# Patient Record
Sex: Male | Born: 1980
Health system: Southern US, Community
[De-identification: ages and names within clinical notes are randomized; demographics above are authoritative.]

## PROBLEM LIST (undated history)

## (undated) DIAGNOSIS — G4730 Sleep apnea, unspecified: Secondary | ICD-10-CM

## (undated) DIAGNOSIS — K219 Gastro-esophageal reflux disease without esophagitis: Secondary | ICD-10-CM

## (undated) HISTORY — DX: Gastro-esophageal reflux disease without esophagitis: K21.9

## (undated) HISTORY — PX: NO PAST SURGERIES: SHX2092

## (undated) HISTORY — DX: Sleep apnea, unspecified: G47.30

---

## 2012-04-27 ENCOUNTER — Telehealth: Payer: Self-pay | Admitting: Nurse Practitioner

## 2012-04-27 NOTE — Telephone Encounter (Signed)
Patient was evaluated at the "clinic" and told he has rosacea.  C/o of facial redness, irritation, and pustules x 1-2 months.  He was hoping to schedule an appt for today.  Has not tried anything OTC.  Suggested a mild facial cleanser and moisturizer or one specifically formulated for rosacea.  Explained that I am unable to schedule an appt for today but may be able to schedule one for later in the week.  He wanted to be seen sooner.  I also suggested that he may want to see a dermatologist for management and that if he is interested in this option he should call his insurance company to make sure a referral is not needed.  Pt was agreeable to this and will call back if needed.

## 2012-06-20 ENCOUNTER — Other Ambulatory Visit: Payer: Self-pay | Admitting: Nurse Practitioner

## 2012-08-16 ENCOUNTER — Other Ambulatory Visit: Payer: Self-pay | Admitting: Family Medicine

## 2012-09-16 ENCOUNTER — Other Ambulatory Visit: Payer: Self-pay | Admitting: Nurse Practitioner

## 2012-10-22 ENCOUNTER — Other Ambulatory Visit: Payer: Self-pay | Admitting: Nurse Practitioner

## 2012-10-26 NOTE — Telephone Encounter (Signed)
Last seen 11/17/11  DFS

## 2012-12-02 ENCOUNTER — Other Ambulatory Visit: Payer: Self-pay

## 2012-12-07 ENCOUNTER — Encounter: Payer: Self-pay | Admitting: Family Medicine

## 2012-12-07 ENCOUNTER — Ambulatory Visit (INDEPENDENT_AMBULATORY_CARE_PROVIDER_SITE_OTHER): Payer: No Typology Code available for payment source | Admitting: Family Medicine

## 2012-12-07 VITALS — BP 124/78 | HR 86 | Temp 98.2°F | Ht 67.0 in | Wt 166.0 lb

## 2012-12-07 DIAGNOSIS — F17200 Nicotine dependence, unspecified, uncomplicated: Secondary | ICD-10-CM

## 2012-12-07 DIAGNOSIS — Z7189 Other specified counseling: Secondary | ICD-10-CM

## 2012-12-07 DIAGNOSIS — Z716 Tobacco abuse counseling: Secondary | ICD-10-CM

## 2012-12-07 DIAGNOSIS — Z136 Encounter for screening for cardiovascular disorders: Secondary | ICD-10-CM

## 2012-12-07 DIAGNOSIS — K219 Gastro-esophageal reflux disease without esophagitis: Secondary | ICD-10-CM

## 2012-12-07 MED ORDER — OMEPRAZOLE 40 MG PO CPDR: 40.0000 mg | DELAYED_RELEASE_CAPSULE | Freq: Every day | ORAL | Status: DC

## 2012-12-07 NOTE — Patient Instructions (Signed)
Heartburn Heartburn is a painful, burning sensation in the chest. It may feel worse in certain positions, such as lying down or bending over. It is caused by stomach acid backing up into the tube that carries food from the mouth down to the stomach (lower esophagus).  CAUSES   Large meals.  Certain foods and drinks.  Exercise.  Increased acid production.  Being overweight or obese.  Certain medicines. SYMPTOMS   Burning pain in the chest or lower throat.  Bitter taste in the mouth.  Coughing. DIAGNOSIS  If the usual treatments for heartburn do not improve your symptoms, then tests may be done to see if there is another condition present. Possible tests may include:  X-rays.  Endoscopy. This is when a tube with a light and a camera on the end is used to examine the esophagus and the stomach.  A test to measure the amount of acid in the esophagus (pH test).  A test to see if the esophagus is working properly (esophageal manometry).  Blood, breath, or stool tests to check for bacteria that cause ulcers. TREATMENT   Your caregiver may tell you to use certain over-the-counter medicines (antacids, acid reducers) for mild heartburn.  Your caregiver may prescribe medicines to decrease the acid in your stomach or protect your stomach lining.  Your caregiver may recommend certain diet changes.  For severe cases, your caregiver may recommend that the head of your bed be elevated on blocks. (Sleeping with more pillows is not an effective treatment as it only changes the position of your head and does not improve the main problem of stomach acid refluxing into the esophagus.) HOME CARE INSTRUCTIONS   Take all medicines as directed by your caregiver.  Raise the head of your bed by putting blocks under the legs if instructed to by your caregiver.  Do not exercise right after eating.  Avoid eating 2 or 3 hours before bed. Do not lie down right after eating.  Eat small meals  throughout the day instead of 3 large meals.  Stop smoking if you smoke.  Maintain a healthy weight.  Identify foods and beverages that make your symptoms worse and avoid them. Foods you may want to avoid include:  Peppers.  Chocolate.  High-fat foods, including fried foods.  Spicy foods.  Garlic and onions.  Citrus fruits, including oranges, grapefruit, lemons, and limes.  Food containing tomatoes or tomato products.  Mint.  Carbonated drinks, caffeinated drinks, and alcohol.  Vinegar. SEEK IMMEDIATE MEDICAL CARE IF:  You have severe chest pain that goes down your arm or into your jaw or neck.  You feel sweaty, dizzy, or lightheaded.  You are short of breath.  You vomit blood.  You have difficulty or pain with swallowing.  You have bloody or black, tarry stools.  You have episodes of heartburn more than 3 times a week for more than 2 weeks. MAKE SURE YOU:  Understand these instructions.  Will watch your condition.  Will get help right away if you are not doing well or get worse. Document Released: 05/25/2008 Document Revised: 03/31/2011 Document Reviewed: 06/23/2010 [facility] Patient Information 2014 [place], [place].    Smoking Cessation Quitting smoking is important to your health and has many advantages. However, it is not always easy to quit since nicotine is a very addictive drug. Often times, people try 3 times or more before being able to quit. This document explains the best ways for you to prepare to quit smoking. Quitting takes hard  work and a lot of effort, but you can do it. ADVANTAGES OF QUITTING SMOKING  You will live longer, feel better, and live better.  Your body will feel the impact of quitting smoking almost immediately.  Within 20 minutes, blood pressure decreases. Your pulse returns to its normal level.  After 8 hours, carbon monoxide levels in the blood return to normal. Your oxygen level increases.  After 24 hours, the chance of  having a heart attack starts to decrease. Your breath, hair, and body stop smelling like smoke.  After 48 hours, damaged nerve endings begin to recover. Your sense of taste and smell improve.  After 72 hours, the body is virtually free of nicotine. Your bronchial tubes relax and breathing becomes easier.  After 2 to 12 weeks, lungs can hold more air. Exercise becomes easier and circulation improves.  The risk of having a heart attack, stroke, cancer, or lung disease is greatly reduced.  After 1 year, the risk of coronary heart disease is cut in half.  After 5 years, the risk of stroke falls to the same as a nonsmoker.  After 10 years, the risk of lung cancer is cut in half and the risk of other cancers decreases significantly.  After 15 years, the risk of coronary heart disease drops, usually to the level of a nonsmoker.  If you are pregnant, quitting smoking will improve your chances of having a healthy baby.  The people you live with, especially any children, will be healthier.  You will have extra money to spend on things other than cigarettes. QUESTIONS TO THINK ABOUT BEFORE ATTEMPTING TO QUIT You may want to talk about your answers with your caregiver.  Why do you want to quit?  If you tried to quit in the past, what helped and what did not?  What will be the most difficult situations for you after you quit? How will you plan to handle them?  Who can help you through the tough times? Your family? Friends? A caregiver?  What pleasures do you get from smoking? What ways can you still get pleasure if you quit? Here are some questions to ask your caregiver:  How can you help me to be successful at quitting?  What medicine do you think would be best for me and how should I take it?  What should I do if I need more help?  What is smoking withdrawal like? How can I get information on withdrawal? GET READY  Set a quit date.  Change your environment by getting rid of all  cigarettes, ashtrays, matches, and lighters in your home, car, or work. Do not let people smoke in your home.  Review your past attempts to quit. Think about what worked and what did not. GET SUPPORT AND ENCOURAGEMENT You have a better chance of being successful if you have help. You can get support in many ways.  Tell your family, friends, and co-workers that you are going to quit and need their support. Ask them not to smoke around you.  Get individual, group, or telephone counseling and support. Programs are available at [employer] and health centers. Call your local health department for information about programs in your area.  Spiritual beliefs and practices may help some smokers quit.  Download a "quit meter" on your computer to keep track of quit statistics, such as how long you have gone without smoking, cigarettes not smoked, and money saved.  Get a self-help book about quitting smoking and staying off  of tobacco. LEARN NEW SKILLS AND BEHAVIORS  Distract yourself from urges to smoke. Talk to someone, go for a walk, or occupy your time with a task.  Change your normal routine. Take a different route to work. Drink tea instead of coffee. Eat breakfast in a different place.  Reduce your stress. Take a hot bath, exercise, or read a book.  Plan something enjoyable to do every day. Reward yourself for not smoking.  Explore interactive web-based programs that specialize in helping you quit. GET MEDICINE AND USE IT CORRECTLY Medicines can help you stop smoking and decrease the urge to smoke. Combining medicine with the above behavioral methods and support can greatly increase your chances of successfully quitting smoking.  Nicotine replacement therapy helps deliver nicotine to your body without the negative effects and risks of smoking. Nicotine replacement therapy includes nicotine gum, lozenges, inhalers, nasal sprays, and skin patches. Some may be available over-the-counter and  others require a prescription.  Antidepressant medicine helps people abstain from smoking, but how this works is unknown. This medicine is available by prescription.  Nicotinic receptor partial agonist medicine simulates the effect of nicotine in your brain. This medicine is available by prescription. Ask your caregiver for advice about which medicines to use and how to use them based on your health history. Your caregiver will tell you what side effects to look out for if you choose to be on a medicine or therapy. Carefully read the information on the package. Do not use any other product containing nicotine while using a nicotine replacement product.  RELAPSE OR DIFFICULT SITUATIONS Most relapses occur within the first 3 months after quitting. Do not be discouraged if you start smoking again. Remember, most people try several times before finally quitting. You may have symptoms of withdrawal because your body is used to nicotine. You may crave cigarettes, be irritable, feel very hungry, cough often, get headaches, or have difficulty concentrating. The withdrawal symptoms are only temporary. They are strongest when you first quit, but they will go away within 10 14 days. To reduce the chances of relapse, try to:  Avoid drinking alcohol. Drinking lowers your chances of successfully quitting.  Reduce the amount of caffeine you consume. Once you quit smoking, the amount of caffeine in your body increases and can give you symptoms, such as a rapid heartbeat, sweating, and anxiety.  Avoid smokers because they can make you want to smoke.  Do not let weight gain distract you. Many smokers will gain weight when they quit, usually less than 10 pounds. Eat a healthy diet and stay active. You can always lose the weight gained after you quit.  Find ways to improve your mood other than smoking. FOR MORE INFORMATION  www.smokefree.gov  Document Released: 12/31/2000 Document Revised: 07/08/2011 Document  Reviewed: 04/17/2011 [facility] Patient Information 2014 [place], [place].

## 2012-12-07 NOTE — Progress Notes (Deleted)
  Subjective:    Patient ID: Jose Rollins, male    DOB: 27-Aug-1980, 32 y.o.   MRN: [id number]  HPI    Review of Systems     Objective:   Physical Exam Filed Vitals:   12/07/12 1038  BP: 124/78  Pulse: 86  Temp: 98.2 F (36.8 C)          Assessment & Plan:

## 2012-12-07 NOTE — Progress Notes (Signed)
Subjective:     Jose Rollins is an 32 y.o. male who presents for evaluation of heartburn. This has been associated with bilious reflux, fullness after meals and heartburn. He denies chest pain, choking on food and upper abdominal discomfort. Symptoms have been present for 2 weeks off of prilosec. He denies dysphagia. He has not lost weight. He denies melena, hematochezia, hematemesis, and coffee ground emesis. Medical therapy in the past has included: proton pump inhibitors. Pt does report smoking, drinking intermittent heavy ETOH and eating spicy/greasy foods.   The following portions of the patient's history were reviewed and updated as appropriate: allergies, current medications, past family history, past medical history, past social history, past surgical history and problem list.  Review of Systems Pertinent items are noted in HPI.   Objective:   Filed Vitals:   12/07/12 1038  BP: 124/78  Pulse: 86  Temp: 98.2 F (36.8 C)      BP 124/78  Pulse 86  Temp(Src) 98.2 F (36.8 C) (Oral)  Ht 5\' 7"  (1.702 m)  Wt 166 lb (75.297 kg)  BMI 25.99 kg/m2  General Appearance:    Alert, cooperative, no distress, appears stated age  Head:    Normocephalic, without obvious abnormality, atraumatic  Eyes:    PERRL, conjunctiva/corneas clear, EOM's intact, fundi    benign, both eyes       Ears:    Normal TM's and external ear canals, both ears  Nose:   Nares normal, septum midline, mucosa normal, no drainage    or sinus tenderness  Throat:   Lips, mucosa, and tongue normal; teeth and gums normal  Neck:   Supple, symmetrical, trachea midline, no adenopathy;       thyroid:  No enlargement/tenderness/nodules; no carotid   bruit or JVD  Back:     Symmetric, no curvature, ROM normal, no CVA tenderness  Lungs:     Clear to auscultation bilaterally, respirations unlabored  Chest wall:    No tenderness or deformity  Heart:    Regular rate and rhythm, S1 and S2 normal, no murmur, rub   or gallop   Abdomen:     Soft, non-tender, bowel sounds active all four quadrants,    no masses, no organomegaly        Extremities:   Extremities normal, atraumatic, no cyanosis or edema  Pulses:   2+ and symmetric all extremities  Skin:   Skin color, texture, turgor normal, no rashes or lesions  Lymph nodes:   Cervical, supraclavicular, and axillary nodes normal  Neurologic:   Normal exam      Assessment:    Gastroesophageal Reflux Disease  Plan:  Screening for cardiovascular condition - Plan: NMR, lipoprofile, Comprehensive metabolic panel  GERD (gastroesophageal reflux disease) - Plan: omeprazole (PRILOSEC) 40 MG capsule  Tobacco abuse counseling  Will restart prilosec.  Discussed trigger avoidance including smoking, ETOH, and greasy/spicy foods.  Handout given.  Check baseline risk stratification labs including lipids and CMET.  Follow up as needed.

## 2012-12-09 LAB — COMPREHENSIVE METABOLIC PANEL
Albumin: 4.6 g/dL (ref 3.5–5.5)
BUN: 11 mg/dL (ref 6–20)
CO2: 26 mmol/L (ref 18–29)
Calcium: 9.7 mg/dL (ref 8.7–10.2)
Chloride: 100 mmol/L (ref 97–108)
Glucose: 97 mg/dL (ref 65–99)
Total Protein: 7.2 g/dL (ref 6.0–8.5)

## 2012-12-09 LAB — NMR, LIPOPROFILE
HDL Cholesterol by NMR: 44 mg/dL (ref >=40)
LDL Size: 21.4 nm (ref >20.5)
Small LDL Particle Number: 396 nmol/L (ref <=527)

## 2013-01-07 NOTE — Progress Notes (Signed)
PT AWARE OF RESULTS 

## 2013-09-02 ENCOUNTER — Ambulatory Visit (INDEPENDENT_AMBULATORY_CARE_PROVIDER_SITE_OTHER): Payer: No Typology Code available for payment source | Admitting: Nurse Practitioner

## 2013-09-02 ENCOUNTER — Encounter: Payer: Self-pay | Admitting: Nurse Practitioner

## 2013-09-02 VITALS — BP 134/90 | HR 100 | Temp 97.0°F | Ht 67.0 in | Wt 180.0 lb

## 2013-09-02 DIAGNOSIS — R599 Enlarged lymph nodes, unspecified: Secondary | ICD-10-CM

## 2013-09-02 DIAGNOSIS — R590 Localized enlarged lymph nodes: Secondary | ICD-10-CM

## 2013-09-02 DIAGNOSIS — J029 Acute pharyngitis, unspecified: Secondary | ICD-10-CM

## 2013-09-02 MED ORDER — AMOXICILLIN 875 MG PO TABS: 875.0000 mg | ORAL_TABLET | Freq: Two times a day (BID) | ORAL | Status: DC

## 2013-09-02 NOTE — Patient Instructions (Signed)
Cervical Adenitis °You have a swollen lymph gland in your neck. This commonly happens with Strep and virus infections, dental problems, insect bites, and injuries about the face, scalp, or neck. The lymph glands swell as the body fights the infection or heals the injury. Swelling and firmness typically lasts for several weeks after the infection or injury is healed. Rarely lymph glands can become swollen because of cancer or TB. °Antibiotics are prescribed if there is evidence of an infection. Sometimes an infected lymph gland becomes filled with pus. This condition may require opening up the abscessed gland by draining it surgically. Most of the time infected glands return to normal within two weeks. Do not poke or squeeze the swollen lymph nodes. That may keep them from shrinking back to their normal size. If the lymph gland is still swollen after 2 weeks, further medical evaluation is needed.  °SEEK IMMEDIATE MEDICAL CARE IF:  °You have difficulty swallowing or breathing, increased swelling, severe pain, or a high fever.  °Document Released: 01/06/2005 Document Revised: 03/31/2011 Document Reviewed: 06/28/2006 °ExitCare® Patient Information ©2015 [employer]. This information is not intended to replace advice given to you by your health care provider. Make sure you discuss any questions you have with your health care provider. ° °

## 2013-09-02 NOTE — Progress Notes (Signed)
   Subjective:    Patient ID: Jose Rollins, male    DOB: May 27, 1980, 33 y.o.   MRN: [id number]  HPI Patient in c/o fever that started fever of 102 Wednesday- has had every night since then- pain in left side neck up into jaw and ear- Pain when swallowing.    Review of Systems  Constitutional: Positive for fever and chills.  HENT: Positive for congestion and sore throat.   Respiratory: Negative for cough and shortness of breath.   Cardiovascular: Negative.   Gastrointestinal: Negative.   Genitourinary: Negative.   Musculoskeletal: Negative.   Neurological: Negative.   Psychiatric/Behavioral: Negative.   All other systems reviewed and are negative.      Objective:   Physical Exam  Constitutional: He is oriented to person, place, and time. He appears well-developed and well-nourished.  HENT:  Right Ear: Hearing, tympanic membrane, external ear and ear canal normal.  Left Ear: Hearing, tympanic membrane, external ear and ear canal normal.  Nose: Mucosal edema and rhinorrhea present. Right sinus exhibits no maxillary sinus tenderness and no frontal sinus tenderness. Left sinus exhibits no maxillary sinus tenderness and no frontal sinus tenderness.  Mouth/Throat: Uvula is midline and mucous membranes are normal. Posterior oropharyngeal erythema: mild.  Eyes: Pupils are equal, round, and reactive to light.  Neck: Normal range of motion.  Cardiovascular: Normal rate, regular rhythm and normal heart sounds.   Pulmonary/Chest: Effort normal and breath sounds normal.  Lymphadenopathy:    He has cervical adenopathy (left tonsillar- tender on palpatiion).  Neurological: He is alert and oriented to person, place, and time.  Skin: Skin is warm.  Face flushed  Psychiatric: He has a normal mood and affect. His behavior is normal. Judgment and thought content normal.   BP 134/90  Pulse 100  Temp(Src) 97 F (36.1 C) (Oral)  Ht 5\' 7"  (1.702 m)  Wt 180 lb (81.647 kg)  BMI 28.19  kg/m2        Assessment & Plan:   1. Anterior cervical adenopathy   2. Acute pharyngitis, unspecified pharyngitis type    Meds ordered this encounter  Medications  . amoxicillin (AMOXIL) 875 MG tablet    Sig: Take 1 tablet (875 mg total) by mouth 2 (two) times daily.    Dispense:  20 tablet    Refill:  0    Order Specific Question:  Supervising Provider    Answer:  Deborra MedinaMOORE, DONALD W [[id number]]   Force fluids Motrin or tylenol OTC for fever and pain Rest  RTO prn  Mary-Margaret Daphine DeutscherMartin, FNP

## 2013-12-12 ENCOUNTER — Telehealth: Payer: Self-pay | Admitting: Family Medicine

## 2013-12-12 NOTE — Telephone Encounter (Signed)
Appointment scheduled for 12/1 at 1 with Paulene FloorMary Martin, FNP

## 2013-12-20 ENCOUNTER — Encounter (INDEPENDENT_AMBULATORY_CARE_PROVIDER_SITE_OTHER): Payer: Self-pay

## 2013-12-20 ENCOUNTER — Ambulatory Visit (INDEPENDENT_AMBULATORY_CARE_PROVIDER_SITE_OTHER): Payer: No Typology Code available for payment source | Admitting: Nurse Practitioner

## 2013-12-20 ENCOUNTER — Encounter: Payer: Self-pay | Admitting: Nurse Practitioner

## 2013-12-20 VITALS — BP 125/83 | HR 78 | Temp 97.5°F | Ht 67.0 in | Wt 192.0 lb

## 2013-12-20 DIAGNOSIS — K219 Gastro-esophageal reflux disease without esophagitis: Secondary | ICD-10-CM

## 2013-12-20 DIAGNOSIS — R0683 Snoring: Secondary | ICD-10-CM

## 2013-12-20 MED ORDER — OMEPRAZOLE 40 MG PO CPDR: 40.0000 mg | DELAYED_RELEASE_CAPSULE | Freq: Every day | ORAL | Status: DC

## 2013-12-20 NOTE — Patient Instructions (Signed)
Sleep Apnea  Sleep apnea is a sleep disorder characterized by abnormal pauses in breathing while you sleep. When your breathing pauses, the level of oxygen in your blood decreases. This causes you to move out of deep sleep and into light sleep. As a result, your quality of sleep is poor, and the system that carries your blood throughout your body (cardiovascular system) experiences stress. If sleep apnea remains untreated, the following conditions can develop:  High blood pressure (hypertension).  Coronary artery disease.  Inability to achieve or maintain an erection (impotence).  Impairment of your thought process (cognitive dysfunction). There are three types of sleep apnea: 1. Obstructive sleep apnea--Pauses in breathing during sleep because of a blocked airway. 2. Central sleep apnea--Pauses in breathing during sleep because the area of the brain that controls your breathing does not send the correct signals to the muscles that control breathing. 3. Mixed sleep apnea--A combination of both obstructive and central sleep apnea. RISK FACTORS The following risk factors can increase your risk of developing sleep apnea:  Being overweight.  Smoking.  Having narrow passages in your nose and throat.  Being of older age.  Being male.  Alcohol use.  Sedative and tranquilizer use.  Ethnicity. Among individuals younger than 35 years, African Americans are at increased risk of sleep apnea. SYMPTOMS   Difficulty staying asleep.  Daytime sleepiness and fatigue.  Loss of energy.  Irritability.  Loud, heavy snoring.  Morning headaches.  Trouble concentrating.  Forgetfulness.  Decreased interest in sex. DIAGNOSIS  In order to diagnose sleep apnea, your caregiver will perform a physical examination. Your caregiver may suggest that you take a home sleep test. Your caregiver may also recommend that you spend the night in a sleep lab. In the sleep lab, several monitors record  information about your heart, lungs, and brain while you sleep. Your leg and arm movements and blood oxygen level are also recorded. TREATMENT The following actions may help to resolve mild sleep apnea:  Sleeping on your side.   Using a decongestant if you have nasal congestion.   Avoiding the use of depressants, including alcohol, sedatives, and narcotics.   Losing weight and modifying your diet if you are overweight. There also are devices and treatments to help open your airway:  Oral appliances. These are custom-made mouthpieces that shift your lower jaw forward and slightly open your bite. This opens your airway.  Devices that create positive airway pressure. This positive pressure "splints" your airway open to help you breathe better during sleep. The following devices create positive airway pressure:  Continuous positive airway pressure (CPAP) device. The CPAP device creates a continuous level of air pressure with an air pump. The air is delivered to your airway through a mask while you sleep. This continuous pressure keeps your airway open.  Nasal expiratory positive airway pressure (EPAP) device. The EPAP device creates positive air pressure as you exhale. The device consists of single-use valves, which are inserted into each nostril and held in place by adhesive. The valves create very little resistance when you inhale but create much more resistance when you exhale. That increased resistance creates the positive airway pressure. This positive pressure while you exhale keeps your airway open, making it easier to breath when you inhale again.  Bilevel positive airway pressure (BPAP) device. The BPAP device is used mainly in patients with central sleep apnea. This device is similar to the CPAP device because it also uses an air pump to deliver continuous air pressure   through a mask. However, with the BPAP machine, the pressure is set at two different levels. The pressure when you  exhale is lower than the pressure when you inhale.  Surgery. Typically, surgery is only done if you cannot comply with less invasive treatments or if the less invasive treatments do not improve your condition. Surgery involves removing excess tissue in your airway to create a wider passage way. Document Released: 12/27/2001 Document Revised: 05/03/2012 Document Reviewed: 05/15/2011 [employer] Patient Information 2015 [employer]. This information is not intended to replace advice given to you by your health care provider. Make sure you discuss any questions you have with your health care provider.  

## 2013-12-20 NOTE — Progress Notes (Signed)
   Subjective:    Patient ID: Jose Rollins, male    DOB: 27-May-1980, 33 y.o.   MRN: [id number]  HPI Patient is here complaining of sleep apnea that has been ongoing for some years. He has never been diagnosed with sleep apnea. He reports of waking up feeling short of breath. He reports frequent congestion. He is a mouth breather. He denies history of asthma. He denies any known allergies. He reports his girlfriend told him he stop breathing during his sleep at times and he snores.    Review of Systems  Constitutional: Negative.   HENT: Positive for congestion and rhinorrhea.   Eyes: Negative.   Respiratory: Positive for shortness of breath (when he wakes up from sleep. ).   Cardiovascular: Negative.   Gastrointestinal: Negative.   Endocrine: Negative.   Genitourinary: Negative.   Musculoskeletal: Negative.   Skin: Negative.   Allergic/Immunologic: Negative.   Neurological: Negative.   Hematological: Negative.   Psychiatric/Behavioral: Negative.   All other systems reviewed and are negative.      Objective:   Physical Exam  Constitutional: He is oriented to person, place, and time. He appears well-developed.  HENT:  Head: Normocephalic and atraumatic.  Right Ear: External ear normal.  Eyes: Conjunctivae are normal. Pupils are equal, round, and reactive to light.  Neck: Normal range of motion.  Cardiovascular: Normal rate and regular rhythm.   Pulmonary/Chest: Effort normal and breath sounds normal. He has no wheezes. He has no rales. He exhibits no tenderness.  Abdominal: Soft.  Musculoskeletal: Normal range of motion. He exhibits no edema or tenderness.  Neurological: He is alert and oriented to person, place, and time. He has normal reflexes.  Skin: Skin is warm.  Psychiatric: He has a normal mood and affect. His behavior is normal. Judgment and thought content normal.    BP 125/83 mmHg  Pulse 78  Temp(Src) 97.5 F (36.4 C) (Oral)  Ht 5\' 7"  (1.702 m)  Wt 192 lb  (87.091 kg)  BMI 30.06 kg/m2       Assessment & Plan:   1. Snoring  - Ambulatory referral to Sleep Studies  Follow up in office once sleep studies is done Sleep on elevated pillows Avoids eating before bed.  RTO prn   Mary-Margaret Daphine DeutscherMartin, FNP

## 2013-12-28 ENCOUNTER — Other Ambulatory Visit: Payer: Self-pay

## 2013-12-28 DIAGNOSIS — R0683 Snoring: Secondary | ICD-10-CM

## 2014-01-16 ENCOUNTER — Ambulatory Visit: Payer: No Typology Code available for payment source | Attending: Family Medicine | Admitting: Sleep Medicine

## 2014-01-16 DIAGNOSIS — G4761 Periodic limb movement disorder: Secondary | ICD-10-CM | POA: Insufficient documentation

## 2014-01-16 DIAGNOSIS — R0683 Snoring: Secondary | ICD-10-CM | POA: Diagnosis present

## 2014-01-16 DIAGNOSIS — G4733 Obstructive sleep apnea (adult) (pediatric): Secondary | ICD-10-CM | POA: Insufficient documentation

## 2014-01-21 NOTE — Sleep Study (Signed)
  [facility] Loral Campi A. Gerilyn Pilgrim, MD     [email or web address]        NOCTURNAL POLYSOMNOGRAM    LOCATION: SLEEP LAB FACILITY: [facility]   PHYSICIAN: Kyler Lerette A. Gerilyn Pilgrim, M.D.   DATE OF STUDY: 01/16/2014.   REFERRING PHYSICIAN: Rudi Heap.   INDICATIONS: The patient is a 34-year-old presents with loud snoring, fatigue and witnessed apneas.  MEDICATIONS:  Prior to Admission medications   Medication Sig Start Date End Date Taking? Authorizing Provider  omeprazole (PRILOSEC) 40 MG capsule Take 1 capsule (40 mg total) by mouth daily. 12/20/13   Mary-Margaret Daphine Deutscher, FNP      EPWORTH SLEEPINESS SCALE: 6.   BMI: 30.   ARCHITECTURAL SUMMARY: Total recording time was 417 minutes. Sleep efficiency 90 %. Sleep latency 7 minutes. REM latency 155 minutes. Stage NI 3 %, N2 33 % and N3 37 % and REM sleep 27 %.    RESPIRATORY DATA:  Baseline oxygen saturation is 94 %. The lowest saturation is 87 %. The diagnostic AHI is 56. The patient was placed on positive pressure treatment starting at 5 and increased to 12. Optimal pressure is 12 with resolution of obstructive events and good tolerance.  LIMB MOVEMENT SUMMARY: PLM index 9.   ELECTROCARDIOGRAM SUMMARY: Average heart rate is 72 with no significant dysrhythmias observed.   IMPRESSION:  1. Severe obstructive sleep apnea syndrome which responds well to a CPAP of 12. 2. Mild periodic limb movement disorder.  Thanks for this referral.  Jaivion Kingsley A. Gerilyn Pilgrim, M.D. Diplomat, Biomedical engineer of Sleep Medicine.

## 2014-04-19 ENCOUNTER — Encounter: Payer: Self-pay | Admitting: Family

## 2014-04-19 ENCOUNTER — Ambulatory Visit (INDEPENDENT_AMBULATORY_CARE_PROVIDER_SITE_OTHER): Payer: BLUE CROSS/BLUE SHIELD | Admitting: Family

## 2014-04-19 VITALS — BP 130/85 | HR 95 | Temp 96.7°F | Ht 67.0 in | Wt 188.0 lb

## 2014-04-19 DIAGNOSIS — R509 Fever, unspecified: Secondary | ICD-10-CM

## 2014-04-19 DIAGNOSIS — R05 Cough: Secondary | ICD-10-CM | POA: Diagnosis not present

## 2014-04-19 DIAGNOSIS — A084 Viral intestinal infection, unspecified: Secondary | ICD-10-CM | POA: Diagnosis not present

## 2014-04-19 DIAGNOSIS — R110 Nausea: Secondary | ICD-10-CM | POA: Diagnosis not present

## 2014-04-19 NOTE — Progress Notes (Signed)
   Subjective:    Patient ID: Jose Rollins, male    DOB: 1981/01/19, 34 y.o.   MRN: [id number]  Fever  This is a new problem. The current episode started yesterday. The problem occurs intermittently. The problem has been waxing and waning. The maximum temperature noted was 102 to 102.9 F. Associated symptoms include congestion, coughing, diarrhea, muscle aches, nausea and sleepiness. Pertinent negatives include no ear pain, headaches, sore throat, urinary pain or vomiting. He has tried acetaminophen for the symptoms. The treatment provided mild relief.      Review of Systems  Constitutional: Positive for fever.  HENT: Positive for congestion. Negative for ear pain and sore throat.   Respiratory: Positive for cough.   Cardiovascular: Negative.   Gastrointestinal: Positive for nausea and diarrhea. Negative for vomiting.  Endocrine: Negative.   Genitourinary: Negative.  Negative for dysuria.  Musculoskeletal: Negative.   Neurological: Negative.  Negative for headaches.  Hematological: Negative.   Psychiatric/Behavioral: Negative.   All other systems reviewed and are negative.      Objective:   Physical Exam  Constitutional: He is oriented to person, place, and time. He appears well-developed and well-nourished. No distress.  HENT:  Head: Normocephalic.  Right Ear: External ear normal.  Left Ear: External ear normal.  Nose: Nose normal.  Mouth/Throat: Oropharynx is clear and moist.  Eyes: Pupils are equal, round, and reactive to light. Right eye exhibits no discharge. Left eye exhibits no discharge.  Neck: Normal range of motion. Neck supple. No thyromegaly present.  Cardiovascular: Normal rate, regular rhythm, normal heart sounds and intact distal pulses.   No murmur heard. Pulmonary/Chest: Effort normal and breath sounds normal. No respiratory distress. He has no wheezes.  Abdominal: Soft. Bowel sounds are normal. He exhibits no distension. There is no tenderness.    Musculoskeletal: Normal range of motion. He exhibits no edema or tenderness.  Neurological: He is alert and oriented to person, place, and time. He has normal reflexes. No cranial nerve deficit.  Skin: Skin is warm and dry. No rash noted. No erythema.  Psychiatric: He has a normal mood and affect. His behavior is normal. Judgment and thought content normal.  Vitals reviewed.     BP 130/85 mmHg  Pulse 95  Temp(Src) 96.7 F (35.9 C) (Oral)  Ht 5\' 7"  (1.702 m)  Wt 188 lb (85.276 kg)  BMI 29.44 kg/m2     Assessment & Plan:  1. Viral gastroenteritis Force fluids Tylenol prn for fever and pain Bland diet Note given for work RTO prn  Jannifer Rodneyhristy Kammi Hechler, FNP

## 2014-04-19 NOTE — Patient Instructions (Signed)

## 2014-07-02 ENCOUNTER — Other Ambulatory Visit: Payer: Self-pay | Admitting: Nurse Practitioner

## 2014-10-06 ENCOUNTER — Other Ambulatory Visit: Payer: Self-pay | Admitting: Family

## 2015-05-03 ENCOUNTER — Other Ambulatory Visit: Payer: Self-pay | Admitting: Family

## 2015-12-09 ENCOUNTER — Other Ambulatory Visit: Payer: Self-pay | Admitting: Family

## 2015-12-16 ENCOUNTER — Other Ambulatory Visit: Payer: Self-pay | Admitting: Family

## 2015-12-30 ENCOUNTER — Other Ambulatory Visit: Payer: Self-pay | Admitting: Family

## 2016-02-01 ENCOUNTER — Encounter: Payer: Self-pay | Admitting: Family

## 2016-02-01 ENCOUNTER — Ambulatory Visit (INDEPENDENT_AMBULATORY_CARE_PROVIDER_SITE_OTHER): Payer: Commercial Managed Care - HMO | Admitting: Family

## 2016-02-01 VITALS — BP 120/87 | HR 98 | Temp 97.1°F | Ht 67.0 in | Wt 189.0 lb

## 2016-02-01 DIAGNOSIS — E663 Overweight: Secondary | ICD-10-CM

## 2016-02-01 DIAGNOSIS — K219 Gastro-esophageal reflux disease without esophagitis: Secondary | ICD-10-CM | POA: Insufficient documentation

## 2016-02-01 DIAGNOSIS — Z0000 Encounter for general adult medical examination without abnormal findings: Secondary | ICD-10-CM | POA: Diagnosis not present

## 2016-02-01 DIAGNOSIS — G4730 Sleep apnea, unspecified: Secondary | ICD-10-CM | POA: Insufficient documentation

## 2016-02-01 DIAGNOSIS — R0683 Snoring: Secondary | ICD-10-CM | POA: Insufficient documentation

## 2016-02-01 DIAGNOSIS — F411 Generalized anxiety disorder: Secondary | ICD-10-CM

## 2016-02-01 MED ORDER — OMEPRAZOLE 40 MG PO CPDR: DELAYED_RELEASE_CAPSULE | ORAL | 1 refills | Status: DC

## 2016-02-01 MED ORDER — ESCITALOPRAM OXALATE 10 MG PO TABS: 10.0000 mg | ORAL_TABLET | Freq: Every day | ORAL | 3 refills | Status: DC

## 2016-02-01 NOTE — Patient Instructions (Signed)

## 2016-02-01 NOTE — Progress Notes (Signed)
   Subjective:    Patient ID: Jose Rollins, male    DOB: March 22, 1980, 36 y.o.   MRN: [id number]  Pt presents to the office today CPE with lab work. Pt states he snores and had a sleep study in 2015. PT states he never heard anything back from his sleep study.  Gastroesophageal Reflux  He reports no belching, no coughing or no heartburn. This is a chronic problem. The current episode started more than 1 year ago. The problem occurs occasionally. The problem has been resolved. The symptoms are aggravated by lying down and certain foods. Risk factors include obesity. He has tried a PPI for the symptoms. The treatment provided significant relief.  Anxiety  Presents for follow-up visit. Symptoms include decreased concentration, excessive worry, irritability, nervous/anxious behavior and restlessness. Symptoms occur most days. The quality of sleep is fair.        Review of Systems  Constitutional: Positive for irritability.  Respiratory: Negative for cough.   Gastrointestinal: Negative for heartburn.  Psychiatric/Behavioral: Positive for decreased concentration. The patient is nervous/anxious.   All other systems reviewed and are negative.      Objective:   Physical Exam  Constitutional: He is oriented to person, place, and time. He appears well-developed and well-nourished. No distress.  HENT:  Head: Normocephalic.  Right Ear: External ear normal.  Left Ear: External ear normal.  Nose: Nose normal.  Mouth/Throat: Oropharynx is clear and moist.  Eyes: Pupils are equal, round, and reactive to light. Right eye exhibits no discharge. Left eye exhibits no discharge.  Neck: Normal range of motion. Neck supple. No thyromegaly present.  Cardiovascular: Normal rate, regular rhythm, normal heart sounds and intact distal pulses.   No murmur heard. Pulmonary/Chest: Effort normal and breath sounds normal. No respiratory distress. He has no wheezes.  Abdominal: Soft. Bowel sounds are normal. He  exhibits no distension. There is no tenderness.  Musculoskeletal: Normal range of motion. He exhibits no edema or tenderness.  Neurological: He is alert and oriented to person, place, and time.  Skin: Skin is warm and dry. No rash noted. No erythema.  Psychiatric: He has a normal mood and affect. His behavior is normal. Judgment and thought content normal.  Vitals reviewed.     BP 120/87   Pulse 98   Temp 97.1 F (36.2 C) (Oral)   Ht _0  (1.702 m)   Wt 189 lb (85.7 kg)   BMI 29.60 kg/m      Assessment & Plan:  1. Gastroesophageal reflux disease, esophagitis presence not specified - CMP14+EGFR  2. Overweight (BMI 25.0-29.9) - CMP14+EGFR  3. Snoring - CMP14+EGFR  4. Annual physical exam - CMP14+EGFR - CBC with Differential/Platelet - Lipid panel - Thyroid Panel With TSH - VITAMIN D 25 Hydroxy (Vit-D Deficiency, Fractures)  5. GAD (generalized anxiety disorder) - CMP14+EGFR - escitalopram (LEXAPRO) 10 MG tablet; Take 1 tablet (10 mg total) by mouth daily.  Dispense: 90 tablet; Refill: 3  6. Sleep apnea, unspecified type - CMP14+EGFR - Ambulatory referral to Pulmonology   Continue all meds Labs pending Health Maintenance reviewed Diet and exercise encouraged RTO 5 weeks to recheck GAD  Evelina Dun, FNP

## 2016-02-01 NOTE — Addendum Note (Signed)
Addended by: Jannifer RodneyHAWKS, [place] Hogsett A on: 02/01/2016 03:48 PM   Modules accepted: Orders

## 2016-02-02 LAB — CMP14+EGFR
A/G RATIO: 1.7 (ref 1.2–2.2)
ALT: 172 IU/L — AB (ref 0–44)
AST: 73 IU/L — AB (ref 0–40)
Albumin: 5.1 g/dL (ref 3.5–5.5)
Alkaline Phosphatase: 74 IU/L (ref 39–117)
BUN/Creatinine Ratio: 13 (ref 9–20)
BUN: 13 mg/dL (ref 6–20)
Bilirubin Total: 0.7 mg/dL (ref 0.0–1.2)
CO2: 23 mmol/L (ref 18–29)
Calcium: 10.2 mg/dL (ref 8.7–10.2)
Chloride: 97 mmol/L (ref 96–106)
Creatinine, Ser: 1.04 mg/dL (ref 0.76–1.27)
GFR calc Af Amer: 106 mL/min/{1.73_m2} (ref >59)
GFR calc non Af Amer: 92 mL/min/{1.73_m2} (ref >59)
Globulin, Total: 3 g/dL (ref 1.5–4.5)
Glucose: 80 mg/dL (ref 65–99)
Potassium: 4.3 mmol/L (ref 3.5–5.2)
Sodium: 140 mmol/L (ref 134–144)
Total Protein: 8.1 g/dL (ref 6.0–8.5)

## 2016-02-02 LAB — CBC WITH DIFFERENTIAL/PLATELET
BASOS: 0 %
Basophils Absolute: 0 10*3/uL (ref 0.0–0.2)
EOS (ABSOLUTE): 0.7 10*3/uL — ABNORMAL HIGH (ref 0.0–0.4)
EOS: 4 %
HEMATOCRIT: 47.8 % (ref 37.5–51.0)
HEMOGLOBIN: 16.4 g/dL (ref 13.0–17.7)
Immature Grans (Abs): 0 10*3/uL (ref 0.0–0.1)
Immature Granulocytes: 0 %
LYMPHS ABS: 4.2 10*3/uL — AB (ref 0.7–3.1)
Lymphs: 26 %
MCH: 30.3 pg (ref 26.6–33.0)
MCHC: 34.3 g/dL (ref 31.5–35.7)
MCV: 88 fL (ref 79–97)
MONOCYTES: 8 %
MONOS ABS: 1.2 10*3/uL — AB (ref 0.1–0.9)
Neutrophils Absolute: 9.7 10*3/uL — ABNORMAL HIGH (ref 1.4–7.0)
Neutrophils: 62 %
Platelets: 307 10*3/uL (ref 150–379)
RBC: 5.42 x10E6/uL (ref 4.14–5.80)
RDW: 13.7 % (ref 12.3–15.4)
WBC: 15.9 10*3/uL — ABNORMAL HIGH (ref 3.4–10.8)

## 2016-02-02 LAB — LIPID PANEL
CHOL/HDL RATIO: 5.5 ratio — AB (ref 0.0–5.0)
Cholesterol, Total: 220 mg/dL — ABNORMAL HIGH (ref 100–199)
HDL: 40 mg/dL (ref >39)
LDL CALC: 147 mg/dL — AB (ref 0–99)
Triglycerides: 167 mg/dL — ABNORMAL HIGH (ref 0–149)
VLDL Cholesterol Cal: 33 mg/dL (ref 5–40)

## 2016-02-02 LAB — VITAMIN D 25 HYDROXY (VIT D DEFICIENCY, FRACTURES): VIT D 25 HYDROXY: 18.1 ng/mL — AB (ref 30.0–100.0)

## 2016-02-02 LAB — THYROID PANEL WITH TSH
Free Thyroxine Index: 1.7 (ref 1.2–4.9)
T3 UPTAKE RATIO: 28 % (ref 24–39)
T4, Total: 6.1 ug/dL (ref 4.5–12.0)
TSH: 2.28 u[IU]/mL (ref 0.450–4.500)

## 2016-02-04 ENCOUNTER — Other Ambulatory Visit: Payer: Self-pay | Admitting: Family

## 2016-02-04 DIAGNOSIS — D72829 Elevated white blood cell count, unspecified: Secondary | ICD-10-CM

## 2016-02-04 DIAGNOSIS — E559 Vitamin D deficiency, unspecified: Secondary | ICD-10-CM | POA: Insufficient documentation

## 2016-02-04 DIAGNOSIS — R748 Abnormal levels of other serum enzymes: Secondary | ICD-10-CM

## 2016-02-04 MED ORDER — VITAMIN D (ERGOCALCIFEROL) 1.25 MG (50000 UNIT) PO CAPS: 50000.0000 [IU] | ORAL_CAPSULE | ORAL | 3 refills | Status: DC

## 2016-02-29 ENCOUNTER — Encounter: Payer: Self-pay | Admitting: Family

## 2016-02-29 DIAGNOSIS — R748 Abnormal levels of other serum enzymes: Secondary | ICD-10-CM

## 2016-03-10 ENCOUNTER — Ambulatory Visit (HOSPITAL_COMMUNITY): Admission: RE | Admit: 2016-03-10 | Payer: Self-pay | Source: Ambulatory Visit

## 2016-03-10 ENCOUNTER — Ambulatory Visit: Payer: Commercial Managed Care - HMO | Admitting: Family

## 2016-03-17 ENCOUNTER — Ambulatory Visit (INDEPENDENT_AMBULATORY_CARE_PROVIDER_SITE_OTHER): Payer: 59 | Admitting: Family

## 2016-03-17 ENCOUNTER — Encounter: Payer: Self-pay | Admitting: Family

## 2016-03-17 VITALS — BP 111/77 | HR 85 | Temp 96.7°F | Ht 67.0 in | Wt 191.4 lb

## 2016-03-17 DIAGNOSIS — R454 Irritability and anger: Secondary | ICD-10-CM | POA: Diagnosis not present

## 2016-03-17 DIAGNOSIS — F411 Generalized anxiety disorder: Secondary | ICD-10-CM | POA: Diagnosis not present

## 2016-03-17 MED ORDER — VENLAFAXINE HCL ER 75 MG PO CP24: 75.0000 mg | ORAL_CAPSULE | Freq: Every day | ORAL | 1 refills | Status: DC

## 2016-03-17 MED ORDER — BUSPIRONE HCL 7.5 MG PO TABS: 7.5000 mg | ORAL_TABLET | Freq: Three times a day (TID) | ORAL | 3 refills | Status: DC

## 2016-03-17 NOTE — Patient Instructions (Signed)
Stress and Stress Management Stress is a normal reaction to life events. It is what you feel when life demands more than you are used to or more than you can handle. Some stress can be useful. For example, the stress reaction can help you catch the last bus of the day, study for a test, or meet a deadline at work. But stress that occurs too often or for too long can cause problems. It can affect your emotional health and interfere with relationships and normal daily activities. Too much stress can weaken your immune system and increase your risk for physical illness. If you already have a medical problem, stress can make it worse. What are the causes? All sorts of life events may cause stress. An event that causes stress for one person may not be stressful for another person. Major life events commonly cause stress. These may be positive or negative. Examples include losing your job, moving into a new home, getting married, having a baby, or losing a loved one. Less obvious life events may also cause stress, especially if they occur day after day or in combination. Examples include working long hours, driving in traffic, caring for children, being in debt, or being in a difficult relationship. What are the signs or symptoms? Stress may cause emotional symptoms including, the following:  Anxiety. This is feeling worried, afraid, on edge, overwhelmed, or out of control.  Anger. This is feeling irritated or impatient.  Depression. This is feeling sad, down, helpless, or guilty.  Difficulty focusing, remembering, or making decisions. Stress may cause physical symptoms, including the following:  Aches and pains. These may affect your head, neck, back, stomach, or other areas of your body.  Tight muscles or clenched jaw.  Low energy or trouble sleeping. Stress may cause unhealthy behaviors, including the following:  Eating to feel better (overeating) or skipping meals.  Sleeping too little, too  much, or both.  Working too much or putting off tasks (procrastination).  Smoking, drinking alcohol, or using drugs to feel better. How is this diagnosed? Stress is diagnosed through an assessment by your health care provider. Your health care provider will ask questions about your symptoms and any stressful life events.Your health care provider will also ask about your medical history and may order blood tests or other tests. Certain medical conditions and medicine can cause physical symptoms similar to stress. Mental illness can cause emotional symptoms and unhealthy behaviors similar to stress. Your health care provider may refer you to a mental health professional for further evaluation. How is this treated? Stress management is the recommended treatment for stress.The goals of stress management are reducing stressful life events and coping with stress in healthy ways. Techniques for reducing stressful life events include the following:  Stress identification. Self-monitor for stress and identify what causes stress for you. These skills may help you to avoid some stressful events.  Time management. Set your priorities, keep a calendar of events, and learn to say "no." These tools can help you avoid making too many commitments. Techniques for coping with stress include the following:  Rethinking the problem. Try to think realistically about stressful events rather than ignoring them or overreacting. Try to find the positives in a stressful situation rather than focusing on the negatives.  Exercise. Physical exercise can release both physical and emotional tension. The key is to find a form of exercise you enjoy and do it regularly.  Relaxation techniques. These relax the body and mind. Examples include yoga,  meditation, tai chi, biofeedback, deep breathing, progressive muscle relaxation, listening to music, being out in nature, journaling, and other hobbies. Again, the key is to find one or  more that you enjoy and can do regularly.  Healthy lifestyle. Eat a balanced diet, get plenty of sleep, and do not smoke. Avoid using alcohol or drugs to relax.  Strong support network. Spend time with family, friends, or other people you enjoy being around.Express your feelings and talk things over with someone you trust. Counseling or talktherapy with a mental health professional may be helpful if you are having difficulty managing stress on your own. Medicine is typically not recommended for the treatment of stress.Talk to your health care provider if you think you need medicine for symptoms of stress. Follow these instructions at home:  Keep all follow-up visits as directed by your health care provider.  Take all medicines as directed by your health care provider. Contact a health care provider if:  Your symptoms get worse or you start having new symptoms.  You feel overwhelmed by your problems and can no longer manage them on your own. Get help right away if:  You feel like hurting yourself or someone else. This information is not intended to replace advice given to you by your health care provider. Make sure you discuss any questions you have with your health care provider. Document Released: 07/02/2000 Document Revised: 06/14/2015 Document Reviewed: 08/31/2012 Elsevier Interactive Patient Education  2017 [employer].

## 2016-03-17 NOTE — Progress Notes (Signed)
   Subjective:    Patient ID: Jose Rollins, male    DOB: July 16, 1980, 36 y.o.   MRN: [id number]  Pt presents to the office today to recheck GAd. Pt was seen on 02/01/16 and started Lexapro 10 mg. PT states he feels his anger is worse. Pt states he got wrote up at work. PT states he quit taking it after two weeks. Pt states he drinks about a 6 pack every other day.  Anxiety  Presents for follow-up visit. Symptoms include decreased concentration, depressed mood, excessive worry, insomnia, irritability, nervous/anxious behavior, panic and restlessness. Patient reports no suicidal ideas. Symptoms occur constantly. The severity of symptoms is moderate and causing significant distress.        Review of Systems  Constitutional: Positive for irritability.  Psychiatric/Behavioral: Positive for decreased concentration. Negative for suicidal ideas. The patient is nervous/anxious and has insomnia.   All other systems reviewed and are negative.      Objective:   Physical Exam  Constitutional: He is oriented to person, place, and time. He appears well-developed and well-nourished. No distress.  HENT:  Head: Normocephalic.  Eyes: Pupils are equal, round, and reactive to light. Right eye exhibits no discharge. Left eye exhibits no discharge.  Neck: Normal range of motion. Neck supple. No thyromegaly present.  Cardiovascular: Normal rate, regular rhythm, normal heart sounds and intact distal pulses.   No murmur heard. Pulmonary/Chest: Effort normal and breath sounds normal. No respiratory distress. He has no wheezes.  Abdominal: Soft. Bowel sounds are normal. He exhibits no distension. There is no tenderness.  Musculoskeletal: Normal range of motion. He exhibits no edema or tenderness.  Neurological: He is alert and oriented to person, place, and time.  Skin: Skin is warm and dry. No rash noted. No erythema.  Psychiatric: He has a normal mood and affect. His behavior is normal. Judgment and thought  content normal.  Vitals reviewed.   BP 111/77   Pulse 85   Temp (!) 96.7 F (35.9 C) (Oral)   Ht 5\' 7"  (1.702 m)   Wt 191 lb 6.4 oz (86.8 kg)   BMI 29.98 kg/m      Assessment & Plan:  1. GAD (generalized anxiety disorder) -Pt started on Effexor today and buspar as needed -Stress management discussed -Referral to psychiatry placed -RTO in 5 weeks  - Ambulatory referral to Psychiatry - venlafaxine XR (EFFEXOR XR) 75 MG 24 hr capsule; Take 1 capsule (75 mg total) by mouth daily with breakfast.  Dispense: 90 capsule; Refill: 1 - busPIRone (BUSPAR) 7.5 MG tablet; Take 1 tablet (7.5 mg total) by mouth 3 (three) times daily.  Dispense: 90 tablet; Refill: 3  2. Difficulty controlling anger - Ambulatory referral to Psychiatry - venlafaxine XR (EFFEXOR XR) 75 MG 24 hr capsule; Take 1 capsule (75 mg total) by mouth daily with breakfast.  Dispense: 90 capsule; Refill: 1 - busPIRone (BUSPAR) 7.5 MG tablet; Take 1 tablet (7.5 mg total) by mouth 3 (three) times daily.  Dispense: 90 tablet; Refill: 3   Jannifer Rodneyhristy Jaise Moser, FNP

## 2016-03-24 ENCOUNTER — Ambulatory Visit (HOSPITAL_COMMUNITY)
Admission: RE | Admit: 2016-03-24 | Discharge: 2016-03-24 | Disposition: A | Discharge status: Home / Self Care | Payer: 59 | Source: Ambulatory Visit | Attending: Family | Admitting: Family

## 2016-03-24 DIAGNOSIS — R748 Abnormal levels of other serum enzymes: Secondary | ICD-10-CM

## 2016-03-25 ENCOUNTER — Ambulatory Visit (HOSPITAL_COMMUNITY)
Admission: RE | Admit: 2016-03-25 | Discharge: 2016-03-25 | Disposition: A | Discharge status: Home / Self Care | Payer: 59 | Source: Ambulatory Visit | Attending: Family | Admitting: Family

## 2016-03-25 DIAGNOSIS — R748 Abnormal levels of other serum enzymes: Secondary | ICD-10-CM | POA: Insufficient documentation

## 2016-03-25 DIAGNOSIS — K824 Cholesterolosis of gallbladder: Secondary | ICD-10-CM | POA: Insufficient documentation

## 2016-04-02 ENCOUNTER — Institutional Professional Consult (permissible substitution): Payer: No Typology Code available for payment source | Admitting: Pulmonary Disease

## 2016-05-06 ENCOUNTER — Encounter (HOSPITAL_COMMUNITY): Payer: Self-pay | Admitting: Psychiatry

## 2016-05-06 ENCOUNTER — Ambulatory Visit (INDEPENDENT_AMBULATORY_CARE_PROVIDER_SITE_OTHER): Payer: 59 | Admitting: Psychiatry

## 2016-05-06 VITALS — BP 126/72 | HR 93 | Resp 16 | Ht 67.0 in | Wt 183.0 lb

## 2016-05-06 DIAGNOSIS — F411 Generalized anxiety disorder: Secondary | ICD-10-CM | POA: Diagnosis not present

## 2016-05-06 DIAGNOSIS — Z789 Other specified health status: Secondary | ICD-10-CM

## 2016-05-06 DIAGNOSIS — F0630 Mood disorder due to known physiological condition, unspecified: Secondary | ICD-10-CM | POA: Diagnosis not present

## 2016-05-06 DIAGNOSIS — Z79899 Other long term (current) drug therapy: Secondary | ICD-10-CM | POA: Diagnosis not present

## 2016-05-06 DIAGNOSIS — F1099 Alcohol use, unspecified with unspecified alcohol-induced disorder: Secondary | ICD-10-CM

## 2016-05-06 DIAGNOSIS — Z7289 Other problems related to lifestyle: Secondary | ICD-10-CM

## 2016-05-06 DIAGNOSIS — F17210 Nicotine dependence, cigarettes, uncomplicated: Secondary | ICD-10-CM | POA: Diagnosis not present

## 2016-05-06 MED ORDER — LAMOTRIGINE 25 MG PO TABS: 25.0000 mg | ORAL_TABLET | Freq: Every day | ORAL | 0 refills | Status: DC

## 2016-05-06 MED ORDER — ACAMPROSATE CALCIUM 333 MG PO TBEC: 666.0000 mg | DELAYED_RELEASE_TABLET | Freq: Two times a day (BID) | ORAL | 0 refills | Status: DC
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
## 2016-05-22 ENCOUNTER — Encounter: Payer: Self-pay | Admitting: Pulmonary Disease

## 2016-05-22 ENCOUNTER — Ambulatory Visit (INDEPENDENT_AMBULATORY_CARE_PROVIDER_SITE_OTHER): Payer: 59 | Admitting: Pulmonary Disease

## 2016-05-22 VITALS — BP 122/88 | HR 98 | Ht 67.0 in | Wt 193.0 lb

## 2016-05-22 DIAGNOSIS — G4733 Obstructive sleep apnea (adult) (pediatric): Secondary | ICD-10-CM

## 2016-05-22 NOTE — Patient Instructions (Signed)
Will arrange for home sleep study Will call to arrange for follow up after sleep study reviewed  

## 2016-05-22 NOTE — Progress Notes (Signed)
   Subjective:    Patient ID: Jose Rollins, male    DOB: September 28, 1980, 36 y.o.   MRN: [id number]  HPI    Review of Systems  Constitutional: Negative for fever and unexpected weight change.  HENT: Negative for congestion, dental problem, ear pain, nosebleeds, postnasal drip, rhinorrhea, sinus pressure, sneezing, sore throat and trouble swallowing.   Eyes: Negative for redness and itching.  Respiratory: Negative for cough, chest tightness, shortness of breath and wheezing.   Cardiovascular: Negative for palpitations and leg swelling.  Gastrointestinal: Negative for nausea and vomiting.  Genitourinary: Negative for dysuria.  Musculoskeletal: Negative for joint swelling.  Skin: Negative for rash.  Neurological: Negative for headaches.  Hematological: Does not bruise/bleed easily.  Psychiatric/Behavioral: Negative for dysphoric mood. The patient is not nervous/anxious.        Objective:   Physical Exam        Assessment & Plan:

## 2016-05-22 NOTE — Progress Notes (Signed)
Past Surgical History He  has a past surgical history that includes No past surgeries.  No Known Allergies  Family History His family history is not on file.  Social History He  reports that he has been smoking Cigarettes.  He started smoking about 17 years ago. He has been smoking about 1.00 pack per day. He has never used smokeless tobacco. He reports that he drinks about 7.2 oz of alcohol per week . He reports that he does not use drugs.  Review of systems Constitutional: Negative for fever and unexpected weight change.  HENT: Negative for congestion, dental problem, ear pain, nosebleeds, postnasal drip, rhinorrhea, sinus pressure, sneezing, sore throat and trouble swallowing.   Eyes: Negative for redness and itching.  Respiratory: Negative for cough, chest tightness, shortness of breath and wheezing.   Cardiovascular: Negative for palpitations and leg swelling.  Gastrointestinal: Negative for nausea and vomiting.  Genitourinary: Negative for dysuria.  Musculoskeletal: Negative for joint swelling.  Skin: Negative for rash.  Neurological: Negative for headaches.  Hematological: Does not bruise/bleed easily.  Psychiatric/Behavioral: Negative for dysphoric mood. The patient is not nervous/anxious.     Current Outpatient Prescriptions on File Prior to Visit  Medication Sig  . acamprosate (CAMPRAL) 333 MG tablet Take 2 tablets (666 mg total) by mouth 2 (two) times daily.  . busPIRone (BUSPAR) 7.5 MG tablet Take 1 tablet (7.5 mg total) by mouth 3 (three) times daily. (Patient taking differently: Take 7.5 mg by mouth daily. )  . lamoTRIgine (LAMICTAL) 25 MG tablet Take 1 tablet (25 mg total) by mouth daily. Take one tablet daily for a week and then start taking 2 tablets.  Marland Kitchen. omeprazole (PRILOSEC) 40 MG capsule TAKE 1 CAPSULE (40 MG TOTAL) BY MOUTH DAILY.  Marland Kitchen. venlafaxine XR (EFFEXOR XR) 75 MG 24 hr capsule Take 1 capsule (75 mg total) by mouth daily with breakfast.  . Vitamin D,  Ergocalciferol, (DRISDOL) 50000 units CAPS capsule Take 1 capsule (50,000 Units total) by mouth every 7 (seven) days.   No current facility-administered medications on file prior to visit.     Chief Complaint  Patient presents with  . SLEEP CONSULT    Referred by Dr Lendon ColonelHawks. Epworth Score: 9    Sleep tests PSG 01/16/14 >> 22.5  Past medical history He  has a past medical history of GERD (gastroesophageal reflux disease) and Sleep apnea.  Vital signs BP 122/88 (BP Location: Right Arm, Cuff Size: Normal)   Pulse 98   Ht 5\' 7"  (1.702 m)   Wt 193 lb (87.5 kg)   SpO2 97%   BMI 30.23 kg/m   History of Present Illness Jose Rollins is a 36 y.o. male for evaluation of sleep problems.  He had a sleep study in 2016.  This showed moderate sleep apnea.  He never heard about results and never was started on therapy.  He was seen by PCP recently and advised that this should be looked into again.  His girlfriend has told him he snores, and stops breathing while asleep.  He will wake up with a choke.  His mouth gets dry at night.  He can fall sleep while watching TV.  He goes to sleep at 9 pm.  He falls asleep after 10 minutes.  He wakes up 1 or 2 times to use the bathroom.  He gets out of bed at 6 am.  He feels tired in the morning.  He denies morning headache.  He does not use anything to help him  fall sleep or stay awake.  He denies sleep walking, sleep talking, bruxism, or nightmares.  There is no history of restless legs.  He denies sleep hallucinations, sleep paralysis, or cataplexy.  The Epworth score is 9 out of 24.   Physical Exam:  General - No distress ENT - No sinus tenderness, no oral exudate, no LAN, no thyromegaly, TM clear, pupils equal/reactive, MP 3 Cardiac - s1s2 regular, no murmur, pulses symmetric Chest - No wheeze/rales/dullness, good air entry, normal respiratory excursion Back - No focal tenderness Abd - Soft, non-tender, no organomegaly, + bowel sounds Ext - No  edema Neuro - Normal strength, cranial nerves intact Skin - No rashes Psych - Normal mood, and behavior  Discussion: He has snoring, sleep disruption, apnea, and daytime sleepiness.  He has history of depression.  He had previous sleep study that showed moderate sleep apnea.  I am concerned he still has sleep apnea.  We discussed how sleep apnea can affect various health problems, including risks for hypertension, cardiovascular disease, and diabetes.  We also discussed how sleep disruption can increase risks for accidents, such as while driving.  Weight loss as a means of improving sleep apnea was also reviewed.  Additional treatment options discussed were CPAP therapy, oral appliance, and surgical intervention.  Assessment/plan:  Obstructive sleep apnea. - will arrange for home sleep study to assess current status   Patient Instructions  Will arrange for home sleep study Will call to arrange for follow up after sleep study reviewed     Coralyn Helling, M.D. Pager [phone] 05/22/2016, 4:13 PM

## 2016-05-29 ENCOUNTER — Encounter (HOSPITAL_COMMUNITY): Payer: Self-pay | Admitting: Psychiatry

## 2016-05-29 ENCOUNTER — Ambulatory Visit (INDEPENDENT_AMBULATORY_CARE_PROVIDER_SITE_OTHER): Payer: 59 | Admitting: Psychiatry

## 2016-05-29 VITALS — BP 124/80 | HR 98 | Resp 18 | Ht 67.0 in | Wt 194.0 lb

## 2016-05-29 DIAGNOSIS — F0630 Mood disorder due to known physiological condition, unspecified: Secondary | ICD-10-CM

## 2016-05-29 DIAGNOSIS — Z789 Other specified health status: Secondary | ICD-10-CM

## 2016-05-29 DIAGNOSIS — G4700 Insomnia, unspecified: Secondary | ICD-10-CM

## 2016-05-29 DIAGNOSIS — Z79899 Other long term (current) drug therapy: Secondary | ICD-10-CM

## 2016-05-29 DIAGNOSIS — F1099 Alcohol use, unspecified with unspecified alcohol-induced disorder: Secondary | ICD-10-CM | POA: Diagnosis not present

## 2016-05-29 DIAGNOSIS — F411 Generalized anxiety disorder: Secondary | ICD-10-CM

## 2016-05-29 DIAGNOSIS — F17210 Nicotine dependence, cigarettes, uncomplicated: Secondary | ICD-10-CM

## 2016-05-29 DIAGNOSIS — Z7289 Other problems related to lifestyle: Secondary | ICD-10-CM

## 2016-05-29 MED ORDER — LAMOTRIGINE 100 MG PO TABS: 100.0000 mg | ORAL_TABLET | Freq: Every day | ORAL | 0 refills | Status: DC

## 2016-05-29 MED ORDER — ACAMPROSATE CALCIUM 333 MG PO TBEC: 666.0000 mg | DELAYED_RELEASE_TABLET | Freq: Two times a day (BID) | ORAL | 0 refills | Status: DC

## 2016-05-29 NOTE — Progress Notes (Signed)
[facility] Outpatient Follow up visit   Patient Identification: Jose Rollins MRN:  [id number] Date of Evaluation:  05/29/2016 Referral Source: Dr. Lendon Colonel, Primary care Chief Complaint:   Chief Complaint    Follow-up     Visit Diagnosis:    ICD-9-CM ICD-10-CM   1. Mood disorder in conditions classified elsewhere 293.83 F06.30   2. GAD (generalized anxiety disorder) 300.02 F41.1   3. Alcohol use V49.89 Z78.9     History of Present Illness:  36 year old, single Caucasian male living with his girlfriend and their 2 kids living age 20 and age 98  Patient was initially  referred for management of anger and anxiety. He has been working 80 hours because he has to pay some bills for the house remodeling.  The recommended to cut down hours and also we worked on all call abstinence now he is not drinking 2 packs he is drinking less and may be 2 drinks last weekend he is on Campral now that is helping the craving. He started Lamictal has only helped somewhat for the mood symptoms he still lashes out or get irritable but overall he is feeling more clear sleep has improved relationship and fights have improved as well. Takes effexor for anxiety. Has cut down buspar for anxiety  Aggravating factor : finances, job stress, hours of work Modifying factors; his son. Relationship getting better No psychotic symptoms No rash    Past Psychiatric History: anxiety   Consequences of Substance Abuse: Medical Consequences:  mood liability  Past Medical History:  Past Medical History:  Diagnosis Date  . GERD (gastroesophageal reflux disease)   . Sleep apnea     Past Surgical History:  Procedure Laterality Date  . NO PAST SURGERIES      Family Psychiatric History: uncle: alcoholism  Family History: History reviewed. No pertinent family history.  Social History:   Social History   Social History  . Marital status: Significant Other    Spouse name: N/A  . Number of children: N/A  . Years of  education: N/A   Occupational History  . Operation Production designer, theatre/television/film    Social History Main Topics  . Smoking status: Current Every Day Smoker    Packs/day: 1.00    Types: Cigarettes    Start date: 01/21/1999  . Smokeless tobacco: Never Used     Comment: reduce # of cigarettes, pt would like to try Chantix  . Alcohol use 7.2 oz/week    12 Cans of beer per week  . Drug use: No  . Sexual activity: Yes    Partners: Female   Other Topics Concern  . None   Social History Narrative  . None     Allergies:  No Known Allergies  Metabolic Disorder Labs: No results found for: HGBA1C, MPG No results found for: PROLACTIN Lab Results  Component Value Date   CHOL 220 (H) 02/01/2016   TRIG 167 (H) 02/01/2016   HDL 40 02/01/2016   CHOLHDL 5.5 (H) 02/01/2016   LDLCALC 147 (H) 02/01/2016   LDLCALC 89 12/07/2012     Current Medications: Current Outpatient Prescriptions  Medication Sig Dispense Refill  . acamprosate (CAMPRAL) 333 MG tablet Take 2 tablets (666 mg total) by mouth 2 (two) times daily. 60 tablet 0  . busPIRone (BUSPAR) 7.5 MG tablet Take 1 tablet (7.5 mg total) by mouth 3 (three) times daily. (Patient taking differently: Take 7.5 mg by mouth daily. ) 90 tablet 3  . lamoTRIgine (LAMICTAL) 100 MG tablet Take 1 tablet (100 mg  total) by mouth daily. 30 tablet 0  . omeprazole (PRILOSEC) 40 MG capsule TAKE 1 CAPSULE (40 MG TOTAL) BY MOUTH DAILY. 90 capsule 1  . venlafaxine XR (EFFEXOR XR) 75 MG 24 hr capsule Take 1 capsule (75 mg total) by mouth daily with breakfast. 90 capsule 1  . Vitamin D, Ergocalciferol, (DRISDOL) 50000 units CAPS capsule Take 1 capsule (50,000 Units total) by mouth every 7 (seven) days. 12 capsule 3   No current facility-administered medications for this visit.       Psychiatric Specialty Exam: Review of Systems  Constitutional: Negative for fever.  Cardiovascular: Negative for palpitations.  Skin: Negative for itching.  Psychiatric/Behavioral: Positive for  substance abuse. The patient has insomnia.     Blood pressure 124/80, pulse 98, resp. rate 18, height 5\' 7"  (1.702 m), weight 194 lb (88 kg), SpO2 96 %.Body mass index is 30.38 kg/m.  General Appearance: Casual  Eye Contact:  Fair  Speech:  Normal Rate  Volume:  Normal  Mood:  Less anxious  Affect:  More reactive  Thought Process:  Goal Directed  Orientation:  Full (Time, Place, and Person)  Thought Content:  Rumination  Suicidal Thoughts:  No  Homicidal Thoughts:  No  Memory:  Immediate;   Fair Recent;   Fair  Judgement:  Fair  Insight:  Shallow  Psychomotor Activity:  Normal  Concentration:  Concentration: Fair and Attention Span: Fair  Recall:  [employer]  Fund of Knowledge:Fair  Language: Fair  Akathisia:  Negative  Handed:  Right  AIMS (if indicated):    Assets:  Desire for Improvement  ADL's:  Intact  Cognition: WNL  Sleep:  Variable to poor    Treatment Plan Summary: Medication management and Plan as follows   Mood disorder; : some improvement. Will increase lamictal to 100mg  qd. Will be on 75mg  for next few days  GAD: some improvement. Continue effexor  Alcohol use: less craving. Continue campral goal is abstinence  InsomniaL reviewed sleep hygiene. Following for sleep study and possible machine for OSA  More than 50% time spent in counseling and coordination of care including patient education and review of side effects  Renewed campral and increased lamictal.  Questions were addressed, FU 4 weeks or earlier if needed  Thresa RossAKHTAR, Candas Deemer, MD 5/10/20184:27 PM

## 2016-06-01 DIAGNOSIS — G4733 Obstructive sleep apnea (adult) (pediatric): Secondary | ICD-10-CM

## 2016-06-03 ENCOUNTER — Telehealth: Payer: Self-pay | Admitting: Pulmonary Disease

## 2016-06-03 ENCOUNTER — Other Ambulatory Visit (HOSPITAL_COMMUNITY): Payer: Self-pay | Admitting: Psychiatry

## 2016-06-03 DIAGNOSIS — G4733 Obstructive sleep apnea (adult) (pediatric): Secondary | ICD-10-CM

## 2016-06-03 NOTE — Telephone Encounter (Signed)
HST 06/01/16 >> AHI 74.3, SaO2 low 76%   Will have my nurse inform pt that sleep study shows severe sleep apnea.  Options are 1) CPAP now, 2) ROV first.  If He is agreeable to CPAP, then please send order for auto CPAP range 5 to 15 cm H2O with heated humidity and mask of choice.  Have download sent 1 month after starting CPAP and set up ROV 2 months after starting CPAP.

## 2016-06-04 ENCOUNTER — Other Ambulatory Visit: Payer: Self-pay | Admitting: *Deleted

## 2016-06-04 DIAGNOSIS — G4733 Obstructive sleep apnea (adult) (pediatric): Secondary | ICD-10-CM

## 2016-06-04 NOTE — Telephone Encounter (Signed)
Received fax from [facility] requesting a refill for Lamictal. Per Dr. Gilmore LarocheAkhtar, refill request is denied. Rx was sent to pharmacy on 05/29/16. Pt's next apt is schedule on 06/26/16. Nothing further is need at this time.

## 2016-06-10 NOTE — Telephone Encounter (Signed)
Results have been explained to patient, pt expressed understanding.  Order placed for CPAP  Appt scheduled with Dr Craige CottaSood on  10/06/16 at 3:45. Nothing further needed.

## 2016-06-23 DIAGNOSIS — G4733 Obstructive sleep apnea (adult) (pediatric): Secondary | ICD-10-CM | POA: Diagnosis not present

## 2016-06-26 ENCOUNTER — Ambulatory Visit (HOSPITAL_COMMUNITY): Payer: Self-pay | Admitting: Psychiatry

## 2016-07-06 ENCOUNTER — Other Ambulatory Visit (HOSPITAL_COMMUNITY): Payer: Self-pay | Admitting: Psychiatry

## 2016-07-07 NOTE — Telephone Encounter (Signed)
Received fax from [facility] requesting a refill for Lamictal. Per Dr. Gilmore LarocheAkhtar, refill is authorize for Lamictal 100mg , #30. Rx was sent to pharmacy. Patient's next apt is schedule on 07/31/16. Lvm informing pt of refill status. Nothing further is need at this time.

## 2016-07-23 DIAGNOSIS — G4733 Obstructive sleep apnea (adult) (pediatric): Secondary | ICD-10-CM | POA: Diagnosis not present

## 2016-07-31 ENCOUNTER — Encounter (HOSPITAL_COMMUNITY): Payer: Self-pay | Admitting: Psychiatry

## 2016-07-31 ENCOUNTER — Ambulatory Visit (INDEPENDENT_AMBULATORY_CARE_PROVIDER_SITE_OTHER): Payer: 59 | Admitting: Psychiatry

## 2016-07-31 VITALS — BP 124/70 | HR 95 | Resp 16 | Ht 67.0 in | Wt 189.0 lb

## 2016-07-31 DIAGNOSIS — Z789 Other specified health status: Secondary | ICD-10-CM

## 2016-07-31 DIAGNOSIS — F411 Generalized anxiety disorder: Secondary | ICD-10-CM | POA: Diagnosis not present

## 2016-07-31 DIAGNOSIS — F0630 Mood disorder due to known physiological condition, unspecified: Secondary | ICD-10-CM

## 2016-07-31 DIAGNOSIS — F17210 Nicotine dependence, cigarettes, uncomplicated: Secondary | ICD-10-CM | POA: Diagnosis not present

## 2016-07-31 DIAGNOSIS — Z7289 Other problems related to lifestyle: Secondary | ICD-10-CM

## 2016-07-31 DIAGNOSIS — Z811 Family history of alcohol abuse and dependence: Secondary | ICD-10-CM

## 2016-07-31 DIAGNOSIS — F1910 Other psychoactive substance abuse, uncomplicated: Secondary | ICD-10-CM | POA: Diagnosis not present

## 2016-07-31 MED ORDER — LAMOTRIGINE 150 MG PO TABS: 150.0000 mg | ORAL_TABLET | Freq: Every day | ORAL | 1 refills | Status: DC

## 2016-07-31 MED ORDER — ESCITALOPRAM OXALATE 5 MG PO TABS
5.0000 mg | ORAL_TABLET | Freq: Every day | ORAL | 1 refills | Status: DC
Start: 2016-07-31 — End: 2017-06-23

## 2016-07-31 NOTE — Progress Notes (Signed)
[facility]   Patient Identification: Jose Rollins MRN:  [id number] Date of Evaluation:  07/31/2016 Referral Source: Dr. Lendon ColonelHawks, Primary care Chief Complaint:   Chief Complaint    Follow-up     Visit Diagnosis:    ICD-10-CM   1. Mood disorder in conditions classified elsewhere F06.30   2. GAD (generalized anxiety disorder) F41.1   3. Alcohol use Z78.9     History of Present Illness:  36 year old, single Caucasian male living with his girlfriend and their 2 kids living age 89 and age 36  Patient was initially  referred for management of anger and anxiety. He has been working 80 hours because he has to pay some bills for the house remodeling.  Patient's cut down hours to 55 hours but still she is somewhat agitated when he comes in he is not drinking sober for  last 1-1/2 month  Not using Campral   feels edgy when he comes  back home he feels agitated at times easily Endorses some worries and depression but not hopelessness was not able to tolerate Effexor does not want to be on Effexor and also has not been taking the BuSpar    Aggravating factor : job hours .  Modifying factors; son.  No psychotic symptoms No rash    Past Psychiatric History: anxiety   Consequences of Substance Abuse: Medical Consequences:  mood liability  Past Medical History:  Past Medical History:  Diagnosis Date  . GERD (gastroesophageal reflux disease)   . Sleep apnea     Past Surgical History:  Procedure Laterality Date  . NO PAST SURGERIES      Family Psychiatric History: uncle: alcoholism  Family History: No family history on file.  Social History:   Social History   Social History  . Marital status: Significant Other    Spouse name: N/A  . Number of children: N/A  . Years of education: N/A   Occupational History  . Operation Production designer, theatre/television/filmManager    Social History Main Topics  . Smoking status: Current Every Day Smoker    Packs/day: 1.00    Types: Cigarettes     Start date: 01/21/1999  . Smokeless tobacco: Never Used     Comment: reduce # of cigarettes, pt would like to try Chantix  . Alcohol use 7.2 oz/week    12 Cans of beer per week     Comment: last drink 1.5 month ago   . Drug use: No  . Sexual activity: Yes    Partners: Female   Other Topics Concern  . None   Social History Narrative  . None     Allergies:  No Known Allergies  Metabolic Disorder Labs: No results found for: HGBA1C, MPG No results found for: PROLACTIN Lab Results  Component Value Date   CHOL 220 (H) 02/01/2016   TRIG 167 (H) 02/01/2016   HDL 40 02/01/2016   CHOLHDL 5.5 (H) 02/01/2016   LDLCALC 147 (H) 02/01/2016   LDLCALC 89 12/07/2012     Current Medications: Current Outpatient Prescriptions  Medication Sig Dispense Refill  . lamoTRIgine (LAMICTAL) 150 MG tablet Take 1 tablet (150 mg total) by mouth daily. 30 tablet 1  . omeprazole (PRILOSEC) 40 MG capsule TAKE 1 CAPSULE (40 MG TOTAL) BY MOUTH DAILY. 90 capsule 1  . Vitamin D, Ergocalciferol, (DRISDOL) 50000 units CAPS capsule Take 1 capsule (50,000 Units total) by mouth every 7 (seven) days. 12 capsule 3  . escitalopram (LEXAPRO) 5 MG tablet Take 1 tablet (5  mg total) by mouth daily. 30 tablet 1   No current facility-administered medications for this visit.       Psychiatric Specialty Exam: Review of Systems  Constitutional: Negative for fever.  Cardiovascular: Negative for chest pain and palpitations.  Skin: Negative for itching.  Psychiatric/Behavioral: Positive for substance abuse.    Blood pressure 124/70, pulse 95, resp. rate 16, height 5\' 7"  (1.702 m), weight 189 lb (85.7 kg), SpO2 96 %.Body mass index is 29.6 kg/m.  General Appearance: Casual  Eye Contact:  Fair  Speech:  Normal Rate  Volume:  Normal  Mood:  Less depressed  Affect:  reactive  Thought Process:  Goal Directed  Orientation:  Full (Time, Place, and Person)  Thought Content:  Rumination  Suicidal Thoughts:  No   Homicidal Thoughts:  No  Memory:  Immediate;   Fair Recent;   Fair  Judgement:  Fair  Insight:  Shallow  Psychomotor Activity:  Normal  Concentration:  Concentration: Fair and Attention Span: Fair  Recall:  [employer] of Knowledge:Fair  Language: Fair  Akathisia:  Negative  Handed:  Right  AIMS (if indicated):    Assets:  Desire for Improvement  ADL's:  Intact  Cognition: WNL  Sleep:  Variable to poor    Treatment Plan Summary: Medication management and Plan as follows   Mood disorder; :feels edgy. Increase lamictal to 150mg . No rash Add small dose of lexapro for depression with agitation 5mg   GAD: fluctuates. Will start lexapro   Alcohol use: less craving. Discussed relapse prevention. Not interested for AA Take some downtime to himself when come from work  To avoid conflicts   Insomnia: not worse. Reviewed sleep hygiene  Questions addressed. Side effects reviewed FU 2 months or earlier if needed  Thresa Ross, MD 7/12/20184:06 PM

## 2016-08-01 ENCOUNTER — Other Ambulatory Visit: Payer: Self-pay | Admitting: Family

## 2016-08-13 ENCOUNTER — Other Ambulatory Visit (HOSPITAL_COMMUNITY): Payer: Self-pay | Admitting: Psychiatry

## 2016-08-13 NOTE — Telephone Encounter (Signed)
Medication refill- received fax from [facility] requesting a refill for Lamictal 100mg . Per Dr. Gilmore LarocheAkhtar, refill request is denied. Lamictal dose was increase from 100mg  to 150mg  on 07/31/16. Rx was sent to pharmacy on 07/31/16 w/ 1 refill. Pt's next apt is schedule on 09/25/16. Nothing further is need at this time.

## 2016-08-20 DIAGNOSIS — Z719 Counseling, unspecified: Secondary | ICD-10-CM | POA: Diagnosis not present

## 2016-08-23 DIAGNOSIS — G4733 Obstructive sleep apnea (adult) (pediatric): Secondary | ICD-10-CM | POA: Diagnosis not present

## 2016-09-01 DIAGNOSIS — Z719 Counseling, unspecified: Secondary | ICD-10-CM | POA: Diagnosis not present

## 2016-09-05 ENCOUNTER — Ambulatory Visit (INDEPENDENT_AMBULATORY_CARE_PROVIDER_SITE_OTHER): Payer: 59 | Admitting: Adult Health

## 2016-09-05 ENCOUNTER — Encounter: Payer: Self-pay | Admitting: Adult Health

## 2016-09-05 DIAGNOSIS — E663 Overweight: Secondary | ICD-10-CM

## 2016-09-05 DIAGNOSIS — G4733 Obstructive sleep apnea (adult) (pediatric): Secondary | ICD-10-CM | POA: Diagnosis not present

## 2016-09-05 NOTE — Progress Notes (Signed)
@Patient  ID: Jose Rollins, male    DOB: 07-29-1980, 36 y.o.   MRN: [id number]  Chief Complaint  Patient presents with  . Follow-up    OSA     Referring provider: Junie Spencer, FNP  HPI: 36 year old male followed for sleep apnea  HST 06/01/16 >> AHI 74.3, SaO2 low 76%  09/05/2016 Follow up : OSA  Patient returns for follow-up sleep apnea. Patient was recently seen for a sleep consult. Patient set up for a home sleep study 06/01/2016 that showed severe sleep apnea with AHI 74/h Patient was started on C Pap at bedtime. Patient says that he is doing well on C Pap. He wears it for about 5 hours each night. Patient feels more rested. Download shows good compliance with 87% usage. Patient is on average. Using a 5 hours each eye. Patient is on AutoSet 5-15 cm H2O. AHI 5. Minimum leaks. Discussed weight loss.  Some nasal congestion    No Known Allergies   There is no immunization history on file for this patient.  Past Medical History:  Diagnosis Date  . GERD (gastroesophageal reflux disease)   . Sleep apnea     Tobacco History: History  Smoking Status  . Current Every Day Smoker  . Packs/day: 1.00  . Types: Cigarettes  . Start date: 01/21/1999  Smokeless Tobacco  . Never Used    Comment: reduce # of cigarettes, pt would like to try Chantix   Ready to quit: Not Answered Counseling given: Not Answered   Outpatient Encounter Prescriptions as of 09/05/2016  Medication Sig  . escitalopram (LEXAPRO) 5 MG tablet Take 1 tablet (5 mg total) by mouth daily.  Marland Kitchen lamoTRIgine (LAMICTAL) 150 MG tablet Take 1 tablet (150 mg total) by mouth daily.  Marland Kitchen omeprazole (PRILOSEC) 40 MG capsule TAKE 1 CAPSULE (40 MG TOTAL) BY MOUTH DAILY.  Marland Kitchen Vitamin D, Ergocalciferol, (DRISDOL) 50000 units CAPS capsule Take 1 capsule (50,000 Units total) by mouth every 7 (seven) days.   No facility-administered encounter medications on file as of 09/05/2016.      Review of Systems  Constitutional:   No   weight loss, night sweats,  Fevers, chills, fatigue, or  lassitude.  HEENT:   No headaches,  Difficulty swallowing,  Tooth/dental problems, or  Sore throat,                No sneezing, itching, ear ache, + nasal congestion, post nasal drip,   CV:  No chest pain,  Orthopnea, PND, swelling in lower extremities, anasarca, dizziness, palpitations, syncope.   GI  No heartburn, indigestion, abdominal pain, nausea, vomiting, diarrhea, change in bowel habits, loss of appetite, bloody stools.   Resp: No shortness of breath with exertion or at rest.  No excess mucus, no productive cough,  No non-productive cough,  No coughing up of blood.  No change in color of mucus.  No wheezing.  No chest wall deformity  Skin: no rash or lesions.  GU: no dysuria, change in color of urine, no urgency or frequency.  No flank pain, no hematuria   MS:  No joint pain or swelling.  No decreased range of motion.  No back pain.    Physical Exam  BP 124/70 (BP Location: Left Arm, Cuff Size: Normal)   Pulse 83   Ht 5\' 7"  (1.702 m)   Wt 185 lb 12.8 oz (84.3 kg)   SpO2 100%   BMI 29.10 kg/m   GEN: A/Ox3; pleasant , NAD, well nourished  HEENT:  [place]/AT,  EACs-clear, TMs-wnl, NOSE-clear, THROAT-clear, no lesions, no postnasal drip or exudate noted. Class 3 MP airway   NECK:  Supple w/ fair ROM; no JVD; normal carotid impulses w/o bruits; no thyromegaly or nodules palpated; no lymphadenopathy.    RESP  Clear  P & A; w/o, wheezes/ rales/ or rhonchi. no accessory muscle use, no dullness to percussion  CARD:  RRR, no m/r/g, no peripheral edema, pulses intact, no cyanosis or clubbing.  GI:   Soft & nt; nml bowel sounds; no organomegaly or masses detected.   Musco: Warm bil, no deformities or joint swelling noted.   Neuro: alert, no focal deficits noted.    Skin: Warm, no lesions or rashes    Lab Results:  CBC   BNP No results found for: BNP  ProBNP No results found for: PROBNP  Imaging: No results  found.   Assessment & Plan:   Sleep apnea Improved control   Plan . Marland Kitchen Patient Instructions  Continue on C Pap at bedtime Try to wear her machine each night for at least 4-6 hours. Do not drive a sleepy Work on weight loss.  Saline nasal rinses As needed   Try zyrtec 10mg  At bedtime  As needed  Drainage .  Follow-up with Dr. Craige Cotta in 4-6 months and As needed        Overweight (BMI 25.0-29.9) Wt loss      Rubye Oaks, NP 09/05/2016

## 2016-09-05 NOTE — Patient Instructions (Addendum)
Continue on C Pap at bedtime Try to wear her machine each night for at least 4-6 hours. Do not drive a sleepy Work on weight loss.  Saline nasal rinses As needed   Try zyrtec 10mg  At bedtime  As needed  Drainage .  Follow-up with Dr. Craige Cotta in 4-6 months and As needed

## 2016-09-05 NOTE — Assessment & Plan Note (Signed)
Improved control   Plan . Jose Rollins Patient Instructions  Continue on C Pap at bedtime Try to wear her machine each night for at least 4-6 hours. Do not drive a sleepy Work on weight loss.  Saline nasal rinses As needed   Try zyrtec 10mg  At bedtime  As needed  Drainage .  Follow-up with Dr. Craige Cotta in 4-6 months and As needed

## 2016-09-05 NOTE — Assessment & Plan Note (Signed)
Wt loss  

## 2016-09-07 NOTE — Progress Notes (Signed)
I have reviewed and agree with assessment/plan.  Fredie Majano, MD [place] [facility] 09/07/2016, 11:08 AM Pager:  [phone]  

## 2016-09-23 DIAGNOSIS — G4733 Obstructive sleep apnea (adult) (pediatric): Secondary | ICD-10-CM | POA: Diagnosis not present

## 2016-09-25 ENCOUNTER — Ambulatory Visit (INDEPENDENT_AMBULATORY_CARE_PROVIDER_SITE_OTHER): Payer: 59 | Admitting: Psychiatry

## 2016-09-25 ENCOUNTER — Encounter (HOSPITAL_COMMUNITY): Payer: Self-pay | Admitting: Psychiatry

## 2016-09-25 VITALS — BP 126/74 | HR 94 | Resp 16 | Ht 67.0 in | Wt 190.0 lb

## 2016-09-25 DIAGNOSIS — F0630 Mood disorder due to known physiological condition, unspecified: Secondary | ICD-10-CM | POA: Diagnosis not present

## 2016-09-25 DIAGNOSIS — Z7289 Other problems related to lifestyle: Secondary | ICD-10-CM

## 2016-09-25 DIAGNOSIS — F1099 Alcohol use, unspecified with unspecified alcohol-induced disorder: Secondary | ICD-10-CM | POA: Diagnosis not present

## 2016-09-25 DIAGNOSIS — G4700 Insomnia, unspecified: Secondary | ICD-10-CM | POA: Diagnosis not present

## 2016-09-25 DIAGNOSIS — Z811 Family history of alcohol abuse and dependence: Secondary | ICD-10-CM

## 2016-09-25 DIAGNOSIS — F411 Generalized anxiety disorder: Secondary | ICD-10-CM

## 2016-09-25 DIAGNOSIS — F17210 Nicotine dependence, cigarettes, uncomplicated: Secondary | ICD-10-CM

## 2016-09-25 DIAGNOSIS — Z789 Other specified health status: Secondary | ICD-10-CM

## 2016-09-25 MED ORDER — OXCARBAZEPINE 150 MG PO TABS
150.0000 mg | ORAL_TABLET | Freq: Two times a day (BID) | ORAL | 1 refills | Status: DC
Start: 2016-09-25 — End: 2016-12-04

## 2016-09-25 MED ORDER — LAMOTRIGINE 150 MG PO TABS: 150.0000 mg | ORAL_TABLET | Freq: Every day | ORAL | 1 refills | Status: DC

## 2016-09-25 NOTE — Progress Notes (Signed)
[facility] Outpatient Follow up visit   Patient Identification: Aidin Doane MRN:  [id number] Date of Evaluation:  09/25/2016 Referral Source: Dr. Lendon Colonel, Primary care Chief Complaint:   Chief Complaint    Follow-up     Visit Diagnosis:    ICD-10-CM   1. Mood disorder in conditions classified elsewhere F06.30   2. GAD (generalized anxiety disorder) F41.1   3. Alcohol use Z78.9     History of Present Illness:  36 year old, single Caucasian male living with his girlfriend and their 2 kids living age 65 and age 30  Patient was initially  referred for management of anger and anxiety. He has been working 80 hours because he has to pay some bills for the house remodeling. He  Patient does cut down hours and also is not drinking for the last 2 months. Feels somewhat restless on lexapro. Which was started last visit  Doing more family things and away from alcohol now    Aggravating factor : job hours .  Modifying factors;son. Family  No psychotic symptoms No rash  Sleep adequate Alcohol use: sober more then 2 months    Past Psychiatric History: anxiety   Consequences of Substance Abuse: Medical Consequences:  mood liability  Past Medical History:  Past Medical History:  Diagnosis Date  . GERD (gastroesophageal reflux disease)   . Sleep apnea     Past Surgical History:  Procedure Laterality Date  . NO PAST SURGERIES      Family Psychiatric History: uncle: alcoholism  Family History: History reviewed. No pertinent family history.  Social History:   Social History   Social History  . Marital status: Significant Other    Spouse name: N/A  . Number of children: N/A  . Years of education: N/A   Occupational History  . Operation Production designer, theatre/television/film    Social History Main Topics  . Smoking status: Current Every Day Smoker    Packs/day: 1.00    Types: Cigarettes    Start date: 01/21/1999  . Smokeless tobacco: Never Used     Comment: reduce # of cigarettes, pt would like to try  Chantix  . Alcohol use No     Comment: last drink 2 month ago   . Drug use: No  . Sexual activity: Yes    Partners: Female   Other Topics Concern  . None   Social History Narrative  . None     Allergies:  No Known Allergies  Metabolic Disorder Labs: No results found for: HGBA1C, MPG No results found for: PROLACTIN Lab Results  Component Value Date   CHOL 220 (H) 02/01/2016   TRIG 167 (H) 02/01/2016   HDL 40 02/01/2016   CHOLHDL 5.5 (H) 02/01/2016   LDLCALC 147 (H) 02/01/2016   LDLCALC 89 12/07/2012     Current Medications: Current Outpatient Prescriptions  Medication Sig Dispense Refill  . lamoTRIgine (LAMICTAL) 150 MG tablet Take 1 tablet (150 mg total) by mouth daily. 30 tablet 1  . omeprazole (PRILOSEC) 40 MG capsule TAKE 1 CAPSULE (40 MG TOTAL) BY MOUTH DAILY. 90 capsule 0  . Vitamin D, Ergocalciferol, (DRISDOL) 50000 units CAPS capsule Take 1 capsule (50,000 Units total) by mouth every 7 (seven) days. 12 capsule 3  . OXcarbazepine (TRILEPTAL) 150 MG tablet Take 1 tablet (150 mg total) by mouth 2 (two) times daily. 30 tablet 1   No current facility-administered medications for this visit.       Psychiatric Specialty Exam: Review of Systems  Constitutional: Negative for fever.  Cardiovascular:  Negative for chest pain and palpitations.  Skin: Negative for rash.    Blood pressure 126/74, pulse 94, resp. rate 16, height 5\' 7"  (1.702 m), weight 190 lb (86.2 kg), SpO2 95 %.Body mass index is 29.76 kg/m.  General Appearance: Casual  Eye Contact:  Fair  Speech:  Normal Rate  Volume:  Normal  Mood:  Improved. But gets restless at night  Affect:  fair  Thought Process:  Goal Directed  Orientation:  Full (Time, Place, and Person)  Thought Content:  Rumination  Suicidal Thoughts:  No  Homicidal Thoughts:  No  Memory:  Immediate;   Fair Recent;   Fair  Judgement:  Fair  Insight:  Shallow  Psychomotor Activity:  Normal  Concentration:  Concentration: Fair and  Attention Span: Fair  Recall:  [employer]  Fund of Knowledge:Fair  Language: Fair  Akathisia:  Negative  Handed:  Right  AIMS (if indicated):    Assets:  Desire for Improvement  ADL's:  Intact  Cognition: WNL  Sleep:  Variable to poor    Treatment Plan Summary: Medication management and Plan as follows   Mood disorder; somewhat edgy at night. Stop lexapo add trileptal 15mg  at night continue lamictal   GAD: fluctuates. Not worse    Alcohol use: not craving. Remains sober. Relapse prevention discussed Insomnia: at times poor sleep. Reviewed sleep hygiene  Questions addressed follow-up in 2 months for continued medications added Trileptal  Thresa RossAKHTAR, Zade Falkner, MD 9/6/20184:08 PM

## 2016-10-06 ENCOUNTER — Ambulatory Visit: Payer: Self-pay | Admitting: Pulmonary Disease

## 2016-10-23 DIAGNOSIS — G4733 Obstructive sleep apnea (adult) (pediatric): Secondary | ICD-10-CM | POA: Diagnosis not present

## 2016-10-29 ENCOUNTER — Other Ambulatory Visit (HOSPITAL_COMMUNITY): Payer: Self-pay | Admitting: Psychiatry

## 2016-10-31 ENCOUNTER — Other Ambulatory Visit: Payer: Self-pay | Admitting: Family

## 2016-11-18 ENCOUNTER — Ambulatory Visit (HOSPITAL_COMMUNITY): Payer: Self-pay | Admitting: Psychiatry

## 2016-11-23 DIAGNOSIS — G4733 Obstructive sleep apnea (adult) (pediatric): Secondary | ICD-10-CM | POA: Diagnosis not present

## 2016-12-03 NOTE — Telephone Encounter (Signed)
Medication refill- received fax from [facility] requesting a refill for Lexapro. Per Dr. Gilmore LarocheAkhtar, refill request is denied. Provider discontinue medication on 09/25/16. Pt is schedule for a follow apt on 12/04/16. Nothing further is need at this time.

## 2016-12-04 ENCOUNTER — Ambulatory Visit (INDEPENDENT_AMBULATORY_CARE_PROVIDER_SITE_OTHER): Payer: 59 | Admitting: Psychiatry

## 2016-12-04 ENCOUNTER — Other Ambulatory Visit: Payer: Self-pay

## 2016-12-04 ENCOUNTER — Encounter (HOSPITAL_COMMUNITY): Payer: Self-pay | Admitting: Psychiatry

## 2016-12-04 VITALS — BP 114/86 | HR 94 | Ht 67.0 in | Wt 194.0 lb

## 2016-12-04 DIAGNOSIS — F0630 Mood disorder due to known physiological condition, unspecified: Secondary | ICD-10-CM | POA: Diagnosis not present

## 2016-12-04 DIAGNOSIS — F17210 Nicotine dependence, cigarettes, uncomplicated: Secondary | ICD-10-CM

## 2016-12-04 DIAGNOSIS — Z789 Other specified health status: Secondary | ICD-10-CM

## 2016-12-04 DIAGNOSIS — F411 Generalized anxiety disorder: Secondary | ICD-10-CM | POA: Diagnosis not present

## 2016-12-04 DIAGNOSIS — F5102 Adjustment insomnia: Secondary | ICD-10-CM

## 2016-12-04 DIAGNOSIS — Z7289 Other problems related to lifestyle: Secondary | ICD-10-CM

## 2016-12-04 MED ORDER — QUETIAPINE FUMARATE 50 MG PO TABS
50.0000 mg | ORAL_TABLET | Freq: Every day | ORAL | 1 refills | Status: DC
Start: 2016-12-04 — End: 2017-01-06

## 2016-12-04 MED ORDER — OXCARBAZEPINE 150 MG PO TABS: 150.0000 mg | ORAL_TABLET | Freq: Two times a day (BID) | ORAL | 1 refills | Status: DC

## 2016-12-04 NOTE — Progress Notes (Signed)
[facility]   Patient Identification: Jose Rollins MRN:  [id number] Date of Evaluation:  12/04/2016 Referral Source: Dr. Lendon ColonelHawks, Primary care Chief Complaint:   Chief Complaint    Anxiety; Follow-up     Visit Diagnosis:    ICD-10-CM   1. Mood disorder in conditions classified elsewhere F06.30   2. GAD (generalized anxiety disorder) F41.1   3. Alcohol use Z78.9   4. Adjustment insomnia F51.02     History of Present Illness:  36 year old, single Caucasian male living with his girlfriend and their 2 kids living age 439 and age 36  Still feels angry. Stopped lamictal. Job stress has increased. Trileptal helps some but he is not sure. Understands work pressure may be contributing. Endorsing more anxiety and poor sleep Have tried SSRi before and buspar didn't work  Not drinking. Remains sober  Doing more family things and away from alcohol now    Aggravating factor : job hours Modifying factors;son. Family  No psychotic symptoms No rash  Sleep broken Alcohol use: sober more then 3 months   Past Psychiatric History: anxiety   Consequences of Substance Abuse: Medical Consequences:  mood liability  Past Medical History:  Past Medical History:  Diagnosis Date  . GERD (gastroesophageal reflux disease)   . Sleep apnea     Past Surgical History:  Procedure Laterality Date  . NO PAST SURGERIES      Family Psychiatric History: uncle: alcoholism  Family History: History reviewed. No pertinent family history.  Social History:   Social History   Socioeconomic History  . Marital status: Significant Other    Spouse name: None  . Number of children: None  . Years of education: None  . Highest education level: None  Social Needs  . Financial resource strain: None  . Food insecurity - worry: None  . Food insecurity - inability: None  . Transportation needs - medical: None  . Transportation needs - non-medical: None  Occupational History  .  Occupation: Camera operatorperation Manager  Tobacco Use  . Smoking status: Current Every Day Smoker    Packs/day: 1.00    Types: Cigarettes    Start date: 01/21/1999  . Smokeless tobacco: Never Used  . Tobacco comment: reduce # of cigarettes, pt would like to try Chantix  Substance and Sexual Activity  . Alcohol use: No    Alcohol/week: 7.2 oz    Types: 12 Cans of beer per week    Comment: last drink 2 month ago   . Drug use: No  . Sexual activity: Yes    Partners: Female  Other Topics Concern  . None  Social History Narrative  . None     Allergies:  No Known Allergies  Metabolic Disorder Labs: No results found for: HGBA1C, MPG No results found for: PROLACTIN Lab Results  Component Value Date   CHOL 220 (H) 02/01/2016   TRIG 167 (H) 02/01/2016   HDL 40 02/01/2016   CHOLHDL 5.5 (H) 02/01/2016   LDLCALC 147 (H) 02/01/2016   LDLCALC 89 12/07/2012     Current Medications: Current Outpatient Medications  Medication Sig Dispense Refill  . omeprazole (PRILOSEC) 40 MG capsule TAKE 1 CAPSULE (40 MG TOTAL) BY MOUTH DAILY. 90 capsule 0  . OXcarbazepine (TRILEPTAL) 150 MG tablet Take 1 tablet (150 mg total) 2 (two) times daily by mouth. 30 tablet 1  . QUEtiapine (SEROQUEL) 50 MG tablet Take 1 tablet (50 mg total) at bedtime by mouth. 30 tablet 1  . Vitamin D, Ergocalciferol, (  DRISDOL) 50000 units CAPS capsule Take 1 capsule (50,000 Units total) by mouth every 7 (seven) days. 12 capsule 3   No current facility-administered medications for this visit.       Psychiatric Specialty Exam: Review of Systems  Constitutional: Negative for fever.  Cardiovascular: Negative for chest pain and palpitations.  Skin: Negative for itching.  Psychiatric/Behavioral: The patient is nervous/anxious.     Blood pressure 114/86, pulse 94, height 5\' 7"  (1.702 m), weight 194 lb (88 kg).Body mass index is 30.38 kg/m.  General Appearance: Casual  Eye Contact:  Fair  Speech:  Normal Rate  Volume:  Normal   Mood:  Somewhat stressed  Affect:  Fair cooperative  Thought Process:  Goal Directed  Orientation:  Full (Time, Place, and Person)  Thought Content:  Rumination  Suicidal Thoughts:  No  Homicidal Thoughts:  No  Memory:  Immediate;   Fair Recent;   Fair  Judgement:  Fair  Insight:  Shallow  Psychomotor Activity:  Normal  Concentration:  Concentration: Fair and Attention Span: Fair  Recall:  [employer]  Fund of Knowledge:Fair  Language: Fair  Akathisia:  Negative  Handed:  Right  AIMS (if indicated):    Assets:  Desire for Improvement  ADL's:  Intact  Cognition: WNL  Sleep:  Variable to poor    Treatment Plan Summary: Medication management and Plan as follows   Mood disorder; enrorses edgy and anxiety. Will start seroquel as stabilizer 25 to 50mg  at night before increasing dose. Continue trileptal. Not taking lamictal  GAD: still stressed. Start seroquel. SSRI didn't work  Alcohol use: not craving. Relapse discussed  Insomnia: ongoing but working on sleep hygiene. Add seroquel Questions addressed fU 2 months  Thresa RossNadeem Emmery Seiler, MD 11/15/20184:01 PM

## 2016-12-21 ENCOUNTER — Other Ambulatory Visit: Payer: Self-pay | Admitting: Family

## 2016-12-22 NOTE — Telephone Encounter (Signed)
Last Vit D 02/01/16  18.1

## 2016-12-23 DIAGNOSIS — G4733 Obstructive sleep apnea (adult) (pediatric): Secondary | ICD-10-CM | POA: Diagnosis not present

## 2017-01-06 ENCOUNTER — Other Ambulatory Visit (HOSPITAL_COMMUNITY): Payer: Self-pay

## 2017-01-06 MED ORDER — QUETIAPINE FUMARATE 50 MG PO TABS: 50.0000 mg | ORAL_TABLET | Freq: Every day | ORAL | 0 refills | Status: DC

## 2017-01-15 ENCOUNTER — Other Ambulatory Visit: Payer: Self-pay | Admitting: *Deleted

## 2017-01-15 MED ORDER — OMEPRAZOLE 40 MG PO CPDR: DELAYED_RELEASE_CAPSULE | ORAL | 2 refills | Status: DC

## 2017-01-23 DIAGNOSIS — G4733 Obstructive sleep apnea (adult) (pediatric): Secondary | ICD-10-CM | POA: Diagnosis not present

## 2017-02-23 DIAGNOSIS — G4733 Obstructive sleep apnea (adult) (pediatric): Secondary | ICD-10-CM | POA: Diagnosis not present

## 2017-03-05 ENCOUNTER — Other Ambulatory Visit (HOSPITAL_COMMUNITY): Payer: Self-pay | Admitting: Psychiatry

## 2017-03-23 DIAGNOSIS — G4733 Obstructive sleep apnea (adult) (pediatric): Secondary | ICD-10-CM | POA: Diagnosis not present

## 2017-04-05 ENCOUNTER — Other Ambulatory Visit: Payer: Self-pay | Admitting: Family

## 2017-04-06 NOTE — Telephone Encounter (Signed)
Last Vit D 01/31/17  18.1

## 2017-06-23 ENCOUNTER — Ambulatory Visit: Payer: 59 | Admitting: Physician Assistant

## 2017-06-23 ENCOUNTER — Encounter: Payer: Self-pay | Admitting: Physician Assistant

## 2017-06-23 VITALS — BP 136/83 | HR 106 | Temp 97.0°F | Ht 67.0 in | Wt 184.8 lb

## 2017-06-23 DIAGNOSIS — J209 Acute bronchitis, unspecified: Secondary | ICD-10-CM | POA: Diagnosis not present

## 2017-06-23 MED ORDER — ALBUTEROL SULFATE HFA 108 (90 BASE) MCG/ACT IN AERS: 2.0000 | INHALATION_SPRAY | RESPIRATORY_TRACT | 0 refills | Status: DC | PRN

## 2017-06-23 MED ORDER — HYDROCODONE-HOMATROPINE 5-1.5 MG/5ML PO SYRP: 5.0000 mL | ORAL_SOLUTION | Freq: Four times a day (QID) | ORAL | 0 refills | Status: DC | PRN

## 2017-06-23 MED ORDER — CLINDAMYCIN HCL 300 MG PO CAPS: 300.0000 mg | ORAL_CAPSULE | Freq: Three times a day (TID) | ORAL | 0 refills | Status: DC

## 2017-06-23 MED ORDER — PREDNISONE 10 MG (21) PO TBPK
ORAL_TABLET | ORAL | 0 refills | Status: DC
Start: 2017-06-23 — End: 2017-07-06

## 2017-06-23 NOTE — Progress Notes (Signed)
BP 136/83   Pulse (!) 106   Temp (!) 97 F (36.1 C) (Oral)   Ht 5\' 7"  (1.702 m)   Wt 184 lb 12.8 oz (83.8 kg)   SpO2 94%   BMI 28.94 kg/m    Subjective:    Patient ID: Jose Rollins, male    DOB: 06/28/1980, 37 y.o.   MRN: [id number]  HPI: Jose Rollins is a 37 y.o. male presenting on 06/23/2017 for Cough; Shortness of Breath; Fever; and Generalized Body Aches  Patient with several days of progressing upper respiratory and bronchial symptoms. Initially there was more upper respiratory congestion. This progressed to having significant cough that is productive throughout the day and severe at night. There is occasional wheezing after coughing. Sometimes there is slight dyspnea on exertion. It is productive mucus that is yellow in color. Denies any blood.   Past Medical History:  Diagnosis Date  . GERD (gastroesophageal reflux disease)   . Sleep apnea    Relevant past medical, surgical, family and social history reviewed and updated as indicated. Interim medical history since our last visit reviewed. Allergies and medications reviewed and updated. DATA REVIEWED: CHART IN EPIC  Family History reviewed for pertinent findings.  Review of Systems  Constitutional: Positive for fatigue and fever. Negative for appetite change.  HENT: Positive for congestion, sinus pressure and sore throat.   Eyes: Negative.  Negative for pain and visual disturbance.  Respiratory: Positive for cough, shortness of breath and wheezing. Negative for chest tightness.   Cardiovascular: Negative.  Negative for chest pain, palpitations and leg swelling.  Gastrointestinal: Negative.  Negative for abdominal pain, diarrhea, nausea and vomiting.  Endocrine: Negative.   Genitourinary: Negative.   Musculoskeletal: Positive for back pain and myalgias.  Skin: Negative.  Negative for color change and rash.  Neurological: Positive for headaches. Negative for weakness and numbness.  Psychiatric/Behavioral: Negative.      Allergies as of 06/23/2017   No Known Allergies     Medication List        Accurate as of 06/23/17 11:03 AM. Always use your most recent med list.          albuterol 108 (90 Base) MCG/ACT inhaler Commonly known as:  PROVENTIL HFA;VENTOLIN HFA Inhale 2 puffs into the lungs every 4 (four) hours as needed for wheezing or shortness of breath.   clindamycin 300 MG capsule Commonly known as:  CLEOCIN Take 1 capsule (300 mg total) by mouth 3 (three) times daily.   HYDROcodone-homatropine 5-1.5 MG/5ML syrup Commonly known as:  HYCODAN Take 5-10 mLs by mouth every 6 (six) hours as needed.   omeprazole 40 MG capsule Commonly known as:  PRILOSEC TAKE 1 CAPSULE (40 MG TOTAL) BY MOUTH DAILY.   OXcarbazepine 150 MG tablet Commonly known as:  TRILEPTAL Take 1 tablet (150 mg total) 2 (two) times daily by mouth.   predniSONE 10 MG (21) Tbpk tablet Commonly known as:  STERAPRED UNI-PAK 21 TAB As directed x 6 days   QUEtiapine 50 MG tablet Commonly known as:  SEROQUEL Take 1 tablet (50 mg total) by mouth at bedtime.   Vitamin D (Ergocalciferol) 50000 units Caps capsule Commonly known as:  DRISDOL TAKE 1 CAPSULE (50,000 UNITS TOTAL) BY MOUTH EVERY 7 (SEVEN) DAYS.          Objective:    BP 136/83   Pulse (!) 106   Temp (!) 97 F (36.1 C) (Oral)   Ht 5\' 7"  (1.702 m)   Wt 184 lb 12.8  oz (83.8 kg)   SpO2 94%   BMI 28.94 kg/m   No Known Allergies  Wt Readings from Last 3 Encounters:  06/23/17 184 lb 12.8 oz (83.8 kg)  09/05/16 185 lb 12.8 oz (84.3 kg)  05/22/16 193 lb (87.5 kg)    Physical Exam  Constitutional: He appears well-developed and well-nourished.  HENT:  Head: Normocephalic and atraumatic.  Right Ear: Hearing and tympanic membrane normal.  Left Ear: Hearing and tympanic membrane normal.  Nose: Mucosal edema and sinus tenderness present. No nasal deformity. Right sinus exhibits frontal sinus tenderness. Left sinus exhibits frontal sinus tenderness.   Mouth/Throat: Posterior oropharyngeal erythema present.  Eyes: Pupils are equal, round, and reactive to light. Conjunctivae and EOM are normal. Right eye exhibits no discharge. Left eye exhibits no discharge.  Neck: Normal range of motion. Neck supple.  Cardiovascular: Normal rate, regular rhythm and normal heart sounds.  Pulmonary/Chest: Effort normal. No respiratory distress. He has no decreased breath sounds. He has wheezes. He has no rhonchi. He has no rales.  Abdominal: Soft. Bowel sounds are normal.  Musculoskeletal: Normal range of motion.  Skin: Skin is warm and dry.        Assessment & Plan:   1. Bronchitis, acute, with bronchospasm - predniSONE (STERAPRED UNI-PAK 21 TAB) 10 MG (21) TBPK tablet; As directed x 6 days  Dispense: 21 tablet; Refill: 0 - clindamycin (CLEOCIN) 300 MG capsule; Take 1 capsule (300 mg total) by mouth 3 (three) times daily.  Dispense: 30 capsule; Refill: 0 - albuterol (PROVENTIL HFA;VENTOLIN HFA) 108 (90 Base) MCG/ACT inhaler; Inhale 2 puffs into the lungs every 4 (four) hours as needed for wheezing or shortness of breath.  Dispense: 1 Inhaler; Refill: 0 - HYDROcodone-homatropine (HYCODAN) 5-1.5 MG/5ML syrup; Take 5-10 mLs by mouth every 6 (six) hours as needed.  Dispense: 240 mL; Refill: 0   Continue all other maintenance medications as listed above.  Follow up plan: No follow-ups on file.  Educational handout given for survey  Remus Loffler PA-C [facility] [address]  [place], [place] [address] [phone]   06/23/2017, 11:03 AM

## 2017-07-06 ENCOUNTER — Encounter: Payer: Self-pay | Admitting: Family Medicine

## 2017-07-06 ENCOUNTER — Ambulatory Visit: Payer: 59 | Admitting: Family Medicine

## 2017-07-06 VITALS — BP 123/75 | HR 91 | Temp 97.0°F | Ht 67.0 in | Wt 185.0 lb

## 2017-07-06 DIAGNOSIS — L0501 Pilonidal cyst with abscess: Secondary | ICD-10-CM

## 2017-07-06 MED ORDER — SULFAMETHOXAZOLE-TRIMETHOPRIM 800-160 MG PO TABS: 1.0000 | ORAL_TABLET | Freq: Two times a day (BID) | ORAL | 0 refills | Status: DC

## 2017-07-06 NOTE — Progress Notes (Signed)
BP 123/75 (BP Location: Left Arm)   Pulse 91   Temp (!) 97 F (36.1 C) (Oral)   Ht 5\' 7"  (1.702 m)   Wt 185 lb (83.9 kg)   BMI 28.98 kg/m    Subjective:    Patient ID: Jose Rollins, male    DOB: 01-24-1980, 37 y.o.   MRN: [id number]  HPI: Jose Rollins is a 37 y.o. male presenting on 07/06/2017 for low back cyst   HPI Cyst on buttocks and abscess Patient comes in complaining of a cyst on his buttocks between his cheeks above his rectum that he noticed about a week ago and has been increasing in size and becoming more painful.  It has drained and is draining purulent material currently.  He denies any fevers or chills or shortness of breath or wheezing.  He denies any issues anywhere else.  Relevant past medical, surgical, family and social history reviewed and updated as indicated. Interim medical history since our last visit reviewed. Allergies and medications reviewed and updated.  Review of Systems  Constitutional: Negative for chills and fever.  Respiratory: Negative for shortness of breath and wheezing.   Cardiovascular: Negative for chest pain and leg swelling.  Musculoskeletal: Negative for back pain and gait problem.  Skin: Positive for color change and wound. Negative for rash.  All other systems reviewed and are negative.   Per HPI unless specifically indicated above   Allergies as of 07/06/2017   No Known Allergies     Medication List        Accurate as of 07/06/17  4:19 PM. Always use your most recent med list.          albuterol 108 (90 Base) MCG/ACT inhaler Commonly known as:  PROVENTIL HFA;VENTOLIN HFA Inhale 2 puffs into the lungs every 4 (four) hours as needed for wheezing or shortness of breath.   omeprazole 40 MG capsule Commonly known as:  PRILOSEC TAKE 1 CAPSULE (40 MG TOTAL) BY MOUTH DAILY.   sulfamethoxazole-trimethoprim 800-160 MG tablet Commonly known as:  BACTRIM DS,SEPTRA DS Take 1 tablet by mouth 2 (two) times daily.   Vitamin D  (Ergocalciferol) 50000 units Caps capsule Commonly known as:  DRISDOL TAKE 1 CAPSULE (50,000 UNITS TOTAL) BY MOUTH EVERY 7 (SEVEN) DAYS.          Objective:    BP 123/75 (BP Location: Left Arm)   Pulse 91   Temp (!) 97 F (36.1 C) (Oral)   Ht 5\' 7"  (1.702 m)   Wt 185 lb (83.9 kg)   BMI 28.98 kg/m   Wt Readings from Last 3 Encounters:  07/06/17 185 lb (83.9 kg)  06/23/17 184 lb 12.8 oz (83.8 kg)  09/05/16 185 lb 12.8 oz (84.3 kg)    Physical Exam  Constitutional: He is oriented to person, place, and time. He appears well-developed and well-nourished. No distress.  Eyes: Conjunctivae are normal. No scleral icterus.  Neurological: He is alert and oriented to person, place, and time. Coordination normal.  Skin: Skin is warm and dry. Lesion noted. No rash noted. He is not diaphoretic. There is erythema.     Psychiatric: He has a normal mood and affect. His behavior is normal.  Nursing note and vitals reviewed.       Assessment & Plan:   Problem List Items Addressed This Visit    None    Visit Diagnoses    Pilonidal cyst with abscess    -  Primary   Relevant Medications  sulfamethoxazole-trimethoprim (BACTRIM DS,SEPTRA DS) 800-160 MG tablet   Other Relevant Orders   Anaerobic and Aerobic Culture (Completed)    Instructed patient to continue to express lesion and keep it draining and will give antibiotic, if worsens or does not improve then may in going to general surgery.  Follow up plan: Return if symptoms worsen or fail to improve.  Counseling provided for all of the vaccine components No orders of the defined types were placed in this encounter.   Arville Care, MD [facility] 07/06/2017, 4:19 PM

## 2017-07-10 LAB — ANAEROBIC AND AEROBIC CULTURE

## 2017-12-30 ENCOUNTER — Other Ambulatory Visit: Payer: Self-pay | Admitting: Family

## 2018-05-02 ENCOUNTER — Other Ambulatory Visit: Payer: Self-pay | Admitting: Family

## 2018-06-29 IMAGING — US US ABDOMEN COMPLETE
1 series · 14 of 25 positions shown · non-contrast
Comparison: None.

CLINICAL DATA: Elevated liver function tests.

EXAM:
ABDOMEN ULTRASOUND COMPLETE

[Series 1: us abdomen complete · 0.19mm/px · 14 of 41 slices shown]
[im 1/41]
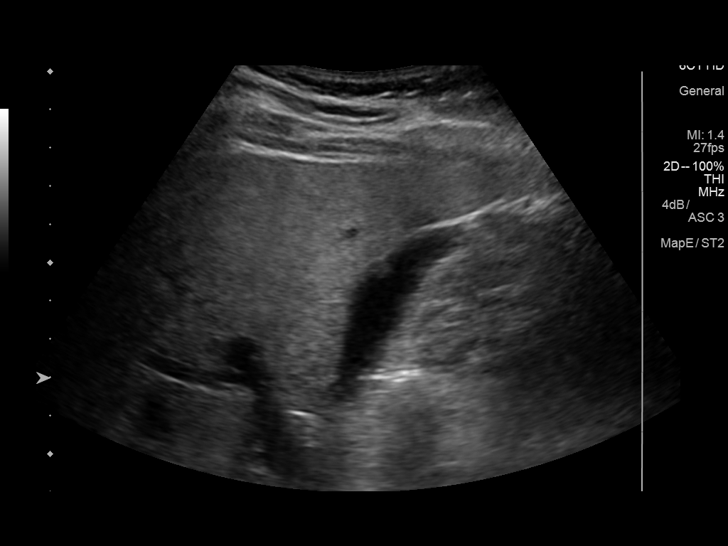
[im 4/41]
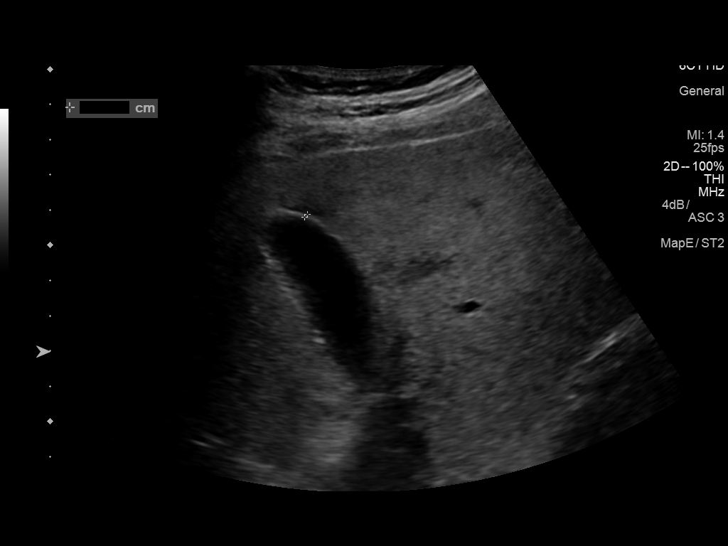
[im 7/41]
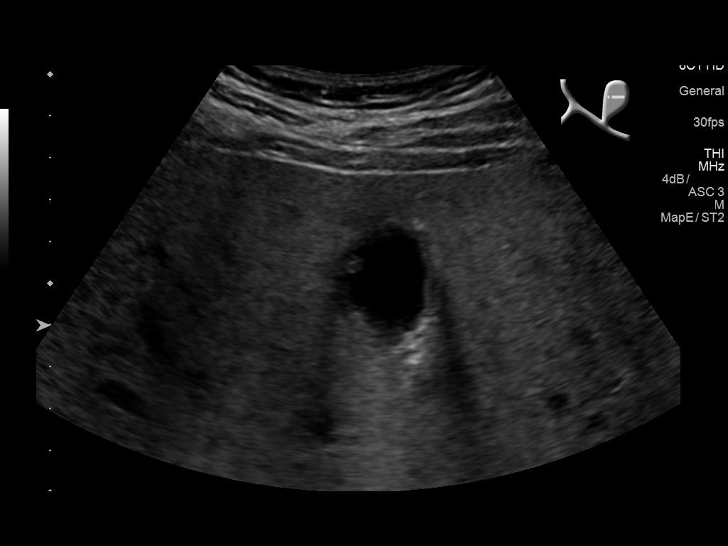
[im 11/41]
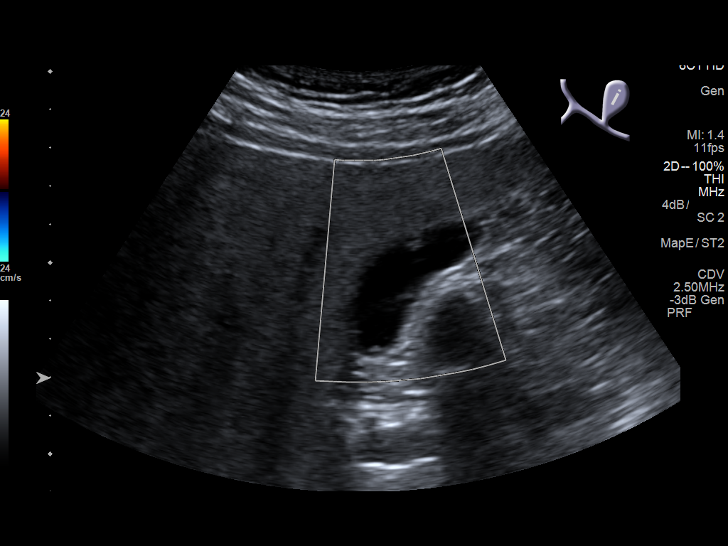
[im 14/41]
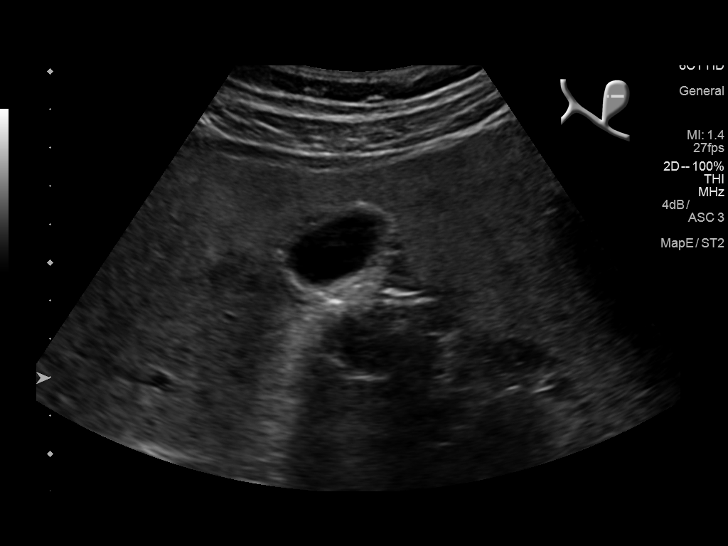
[im 16/41]
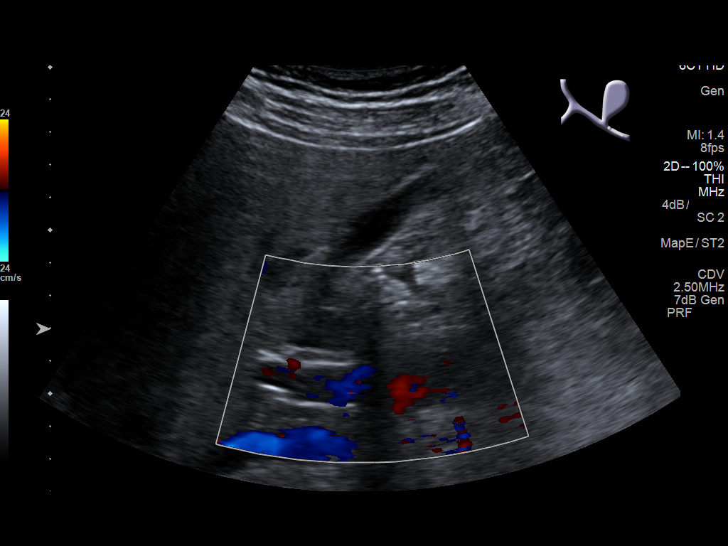
[im 19/41]
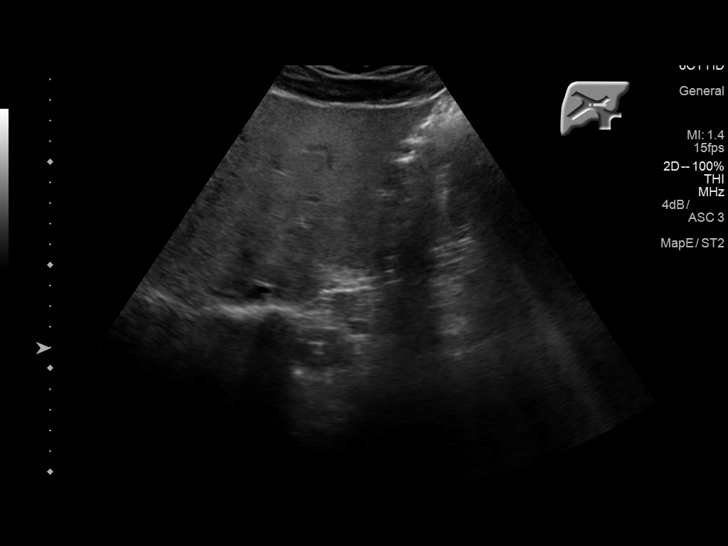
[im 22/41]
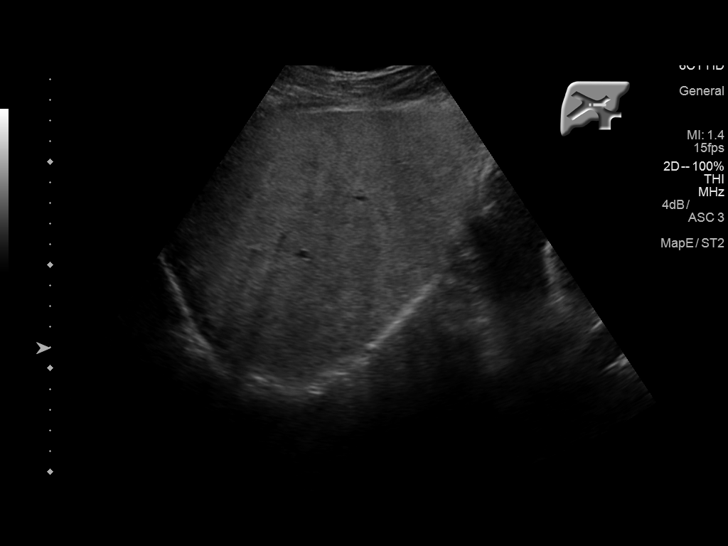
[im 26/41]
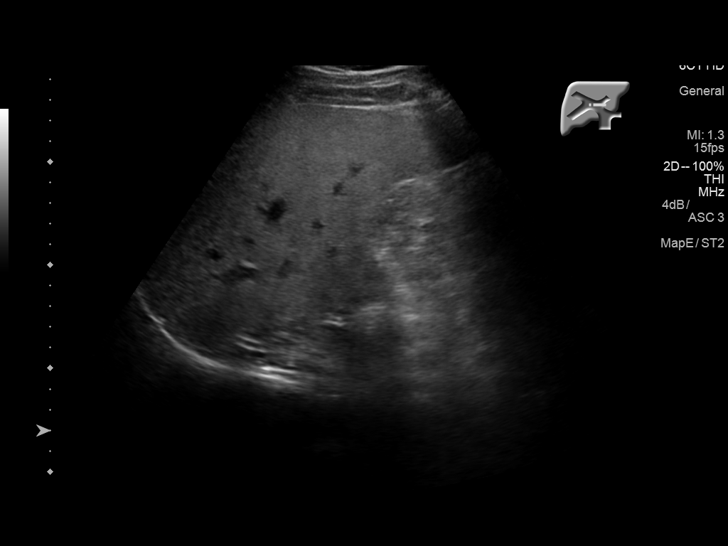
[im 27/41]
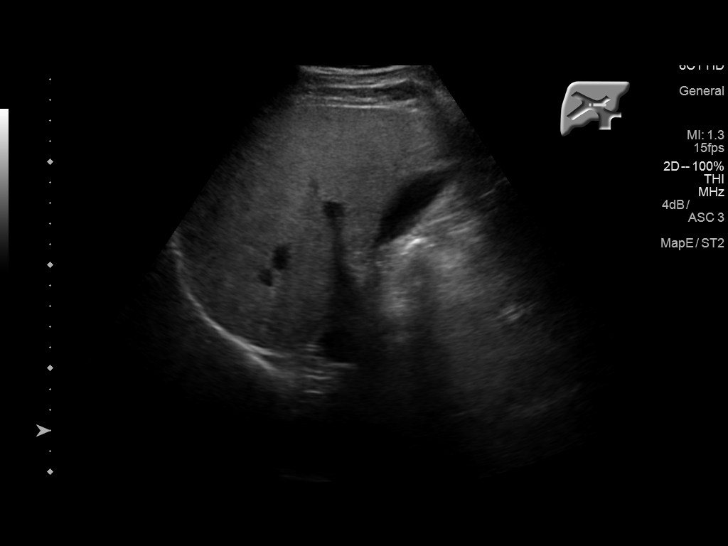
[im 31/41]
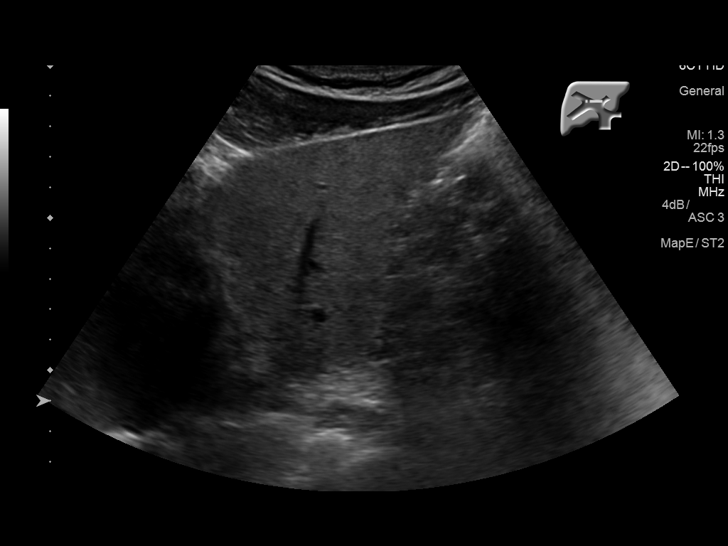
[im 34/41]
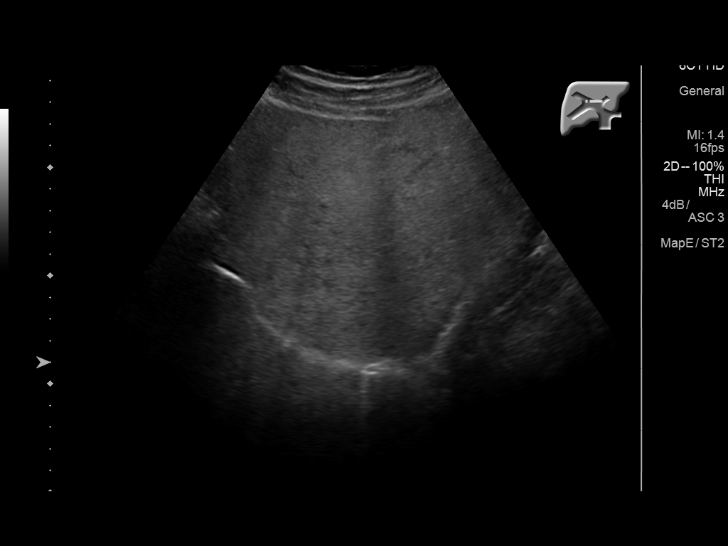
[im 37/41]
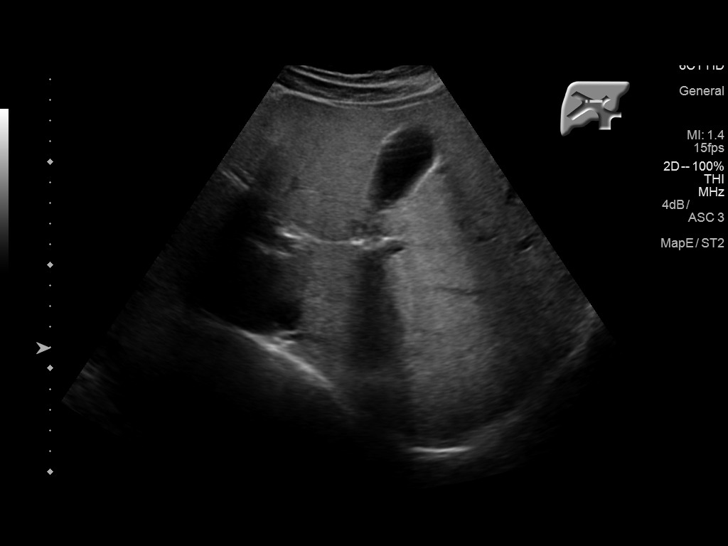
[im 41/41]
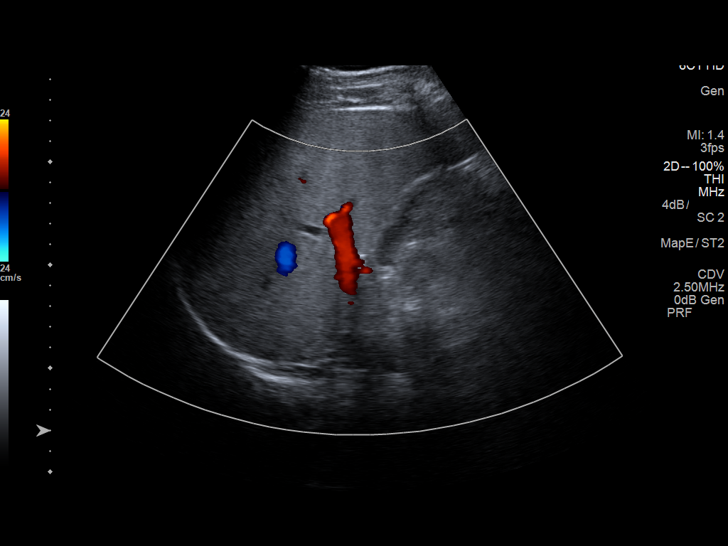

[14 of 25 positions shown; findings below may reference images not displayed]

FINDINGS: Gallbladder: No gallstones or wall thickening visualized. No
sonographic Murphy sign noted by sonographer. 5 mm polyp is noted.

Common bile duct: Diameter: 2.8 mm which is within normal limits.

Liver: No focal lesion identified. Increased echogenicity of hepatic
parenchyma is noted consistent with fatty infiltration.

IVC: No abnormality visualized.

Pancreas: Visualized portion unremarkable.

Spleen: Size and appearance within normal limits.

Right Kidney: Length: 10.9 cm. Echogenicity within normal limits. No
mass or hydronephrosis visualized.

Left Kidney: Length: 11.8 cm. Echogenicity within normal limits. No
mass or hydronephrosis visualized.

Abdominal aorta: No aneurysm visualized.

Other findings: None.
IMPRESSION: Small gallbladder polyp. Increased echogenicity of hepatic
parenchyma consistent with fatty infiltration or other diffuse
hepatocellular disease. No other abnormality seen in the abdomen.

## 2018-08-03 ENCOUNTER — Other Ambulatory Visit: Payer: Self-pay

## 2018-08-03 ENCOUNTER — Encounter: Payer: Self-pay | Admitting: Family

## 2018-08-03 ENCOUNTER — Ambulatory Visit (INDEPENDENT_AMBULATORY_CARE_PROVIDER_SITE_OTHER): Payer: 59 | Admitting: Family

## 2018-08-03 DIAGNOSIS — R21 Rash and other nonspecific skin eruption: Secondary | ICD-10-CM

## 2018-08-03 NOTE — Progress Notes (Signed)
   Virtual Visit via telephone Note  I connected with Jose Rollins on 08/03/18 at 9:05 AM by telephone and verified that I am speaking with the correct person using two identifiers. Jose Rollins is currently located at work and no one is currently with her during visit. The provider, Evelina Dun, FNP is located in their office at time of visit.  I discussed the limitations, risks, security and privacy concerns of performing an evaluation and management service by telephone and the availability of in person appointments. I also discussed with the patient that there may be a patient responsible charge related to this service. The patient expressed understanding and agreed to proceed.   History and Present Illness:  Pt calls the office today with a complaint of a rash on his face and side that occurred yesterday morning. He states he took Benadryl and "passed out" all day, but when he woke up this morning the still feels sleepy. The rash has improved, but is still slightly on his face. He states he is going home to rest. He did eat lobster and shrimp two nights ago, but states he eats all the time.  Rash This is a new problem. The current episode started yesterday. The problem has been waxing and waning since onset. The affected locations include the face and back. He was exposed to nothing. Past treatments include antihistamine. The treatment provided moderate relief.      Review of Systems  Skin: Positive for rash.  All other systems reviewed and are negative.    Observations/Objective: No SOB or distress   Assessment and Plan: 1. Rash and nonspecific skin eruption More than likely allergic reaction He will take zyrtec today Encouraged to write down every he ate and drank and if rash reoccurs again to write down things he has come into contact with Rest Do not scratch Any SOB or swelling go to ED      I discussed the assessment and treatment plan with the patient. The patient  was provided an opportunity to ask questions and all were answered. The patient agreed with the plan and demonstrated an understanding of the instructions.   The patient was advised to call back or seek an in-person evaluation if the symptoms worsen or if the condition fails to improve as anticipated.  The above assessment and management plan was discussed with the patient. The patient verbalized understanding of and has agreed to the management plan. Patient is aware to call the clinic if symptoms persist or worsen. Patient is aware when to return to the clinic for a follow-up visit. Patient educated on when it is appropriate to go to the emergency department.   Time call ended:  9:17 AM  I provided 12 minutes of non-face-to-face time during this encounter.    Evelina Dun, FNP

## 2019-10-28 ENCOUNTER — Ambulatory Visit (INDEPENDENT_AMBULATORY_CARE_PROVIDER_SITE_OTHER): Payer: 59 | Admitting: Family

## 2019-10-28 ENCOUNTER — Encounter: Payer: Self-pay | Admitting: Family

## 2019-10-28 DIAGNOSIS — F411 Generalized anxiety disorder: Secondary | ICD-10-CM

## 2019-10-28 DIAGNOSIS — R454 Irritability and anger: Secondary | ICD-10-CM | POA: Diagnosis not present

## 2019-10-28 MED ORDER — BUSPIRONE HCL 5 MG PO TABS: 5.0000 mg | ORAL_TABLET | Freq: Three times a day (TID) | ORAL | 3 refills | Status: DC | PRN

## 2019-10-28 MED ORDER — ESCITALOPRAM OXALATE 10 MG PO TABS: 10.0000 mg | ORAL_TABLET | Freq: Every day | ORAL | 5 refills | Status: DC

## 2019-10-28 NOTE — Progress Notes (Signed)
Virtual Visit via telephone Note Due to COVID-19 pandemic this visit was conducted virtually. This visit type was conducted due to national recommendations for restrictions regarding the COVID-19 Pandemic (e.g. social distancing, sheltering in place) in an effort to limit this patient's exposure and mitigate transmission in our community. All issues noted in this document were discussed and addressed.  A physical exam was not performed with this format.  I connected with Jose Rollins on 10/28/19 at 12:25 pm by telephone and verified that I am speaking with the correct person using two identifiers. Jose Rollins is currently located at car and no one is currently with him during visit. The provider, Jannifer Rodney, FNP is located in their office at time of visit.  I discussed the limitations, risks, security and privacy concerns of performing an evaluation and management service by telephone and the availability of in person appointments. I also discussed with the patient that there may be a patient responsible charge related to this service. The patient expressed understanding and agreed to proceed.   History and Present Illness:  Pt calls the office today requesting a referral to [facility]. He states he has not been going in years, about 2-3 years. He states his anger has worsen and causing problems at his job and relationships.  Anxiety Presents for follow-up visit. Symptoms include depressed mood, excessive worry, irritability, nervous/anxious behavior and restlessness. Symptoms occur constantly. The severity of symptoms is severe.    Nicotine Dependence Presents for follow-up visit. Symptoms include irritability. His urge triggers include company of smokers. The symptoms have been stable. He smokes 1 pack (1 1/2 pack) of cigarettes per day.      Review of Systems  Constitutional: Positive for irritability.  Psychiatric/Behavioral: The patient is nervous/anxious.       Observations/Objective: No SOB or distress noted, pt anxious   Assessment and Plan: [name] comes in today with chief complaint of No chief complaint on file.   Diagnosis and orders addressed:  1. GAD (generalized anxiety disorder) - Ambulatory referral to Psychiatry - escitalopram (LEXAPRO) 10 MG tablet; Take 1 tablet (10 mg total) by mouth daily.  Dispense: 30 tablet; Refill: 5 - busPIRone (BUSPAR) 5 MG tablet; Take 1 tablet (5 mg total) by mouth 3 (three) times daily as needed.  Dispense: 90 tablet; Refill: 3  2. Excessive anger - Ambulatory referral to Psychiatry - escitalopram (LEXAPRO) 10 MG tablet; Take 1 tablet (10 mg total) by mouth daily.  Dispense: 30 tablet; Refill: 5 - busPIRone (BUSPAR) 5 MG tablet; Take 1 tablet (5 mg total) by mouth 3 (three) times daily as needed.  Dispense: 90 tablet; Refill: 3   Will start lexapro 10 mg and Buspar 5 mg TID Referral pending to [facility] Maintenance reviewed Diet and exercise encouraged   Follow Up Instructions: 6 weeks to recheck GAD    I discussed the assessment and treatment plan with the patient. The patient was provided an opportunity to ask questions and all were answered. The patient agreed with the plan and demonstrated an understanding of the instructions.   The patient was advised to call back or seek an in-person evaluation if the symptoms worsen or if the condition fails to improve as anticipated.  The above assessment and management plan was discussed with the patient. The patient verbalized understanding of and has agreed to the management plan. Patient is aware to call the clinic if symptoms persist or worsen. Patient is aware when to return to the clinic  for a follow-up visit. Patient educated on when it is appropriate to go to the emergency department.   Time call ended:  12:37 pm   I provided 12 minutes of non-face-to-face time during this encounter.    Jannifer Rodney, FNP

## 2019-11-22 ENCOUNTER — Telehealth (HOSPITAL_COMMUNITY): Payer: 59 | Admitting: Psychiatry

## 2019-12-09 ENCOUNTER — Telehealth: Payer: Self-pay

## 2019-12-09 MED ORDER — BUSPIRONE HCL 7.5 MG PO TABS: 7.5000 mg | ORAL_TABLET | Freq: Three times a day (TID) | ORAL | 2 refills | Status: DC | PRN

## 2019-12-09 MED ORDER — ESCITALOPRAM OXALATE 20 MG PO TABS: 20.0000 mg | ORAL_TABLET | Freq: Every day | ORAL | 5 refills | Status: DC

## 2019-12-09 NOTE — Telephone Encounter (Signed)
Pt wants to ask Christy to up dosage on escitalopram (LEXAPRO) 10 MG tablet busPIRone (BUSPAR) 5 MG tablet. He is having panic attacks and anxiety. Please call back

## 2019-12-09 NOTE — Telephone Encounter (Signed)
Patient aware and will call back to set up an appointment  

## 2019-12-09 NOTE — Telephone Encounter (Signed)
We increased Lexapro to 20 mg and Buspar 7.5 mg TID prn. Need follow up in 4 weeks with me.

## 2020-01-03 ENCOUNTER — Other Ambulatory Visit: Payer: Self-pay | Admitting: Family

## 2020-01-05 ENCOUNTER — Telehealth: Payer: Self-pay

## 2020-01-06 NOTE — Telephone Encounter (Signed)
Returned patients call- states that his therapist wants to talk to [place] about medication options for patient.  Requesting Christy call

## 2020-01-06 NOTE — Telephone Encounter (Signed)
Called and voicemail left

## 2020-01-09 ENCOUNTER — Other Ambulatory Visit: Payer: Self-pay | Admitting: Family

## 2020-01-24 ENCOUNTER — Telehealth: Payer: Self-pay

## 2020-01-24 NOTE — Telephone Encounter (Signed)
Did you speak to therapist?

## 2020-01-25 ENCOUNTER — Telehealth: Payer: Self-pay

## 2020-01-25 NOTE — Telephone Encounter (Signed)
Patient will call in to make appt

## 2020-01-26 NOTE — Telephone Encounter (Signed)
No answer, mailbox full

## 2020-01-26 NOTE — Telephone Encounter (Signed)
Yes, please schedule him a visit. It can be a telephone visit.

## 2020-02-06 NOTE — Telephone Encounter (Signed)
Phone call taken care of in different encounter.  This encounter will now be closed  

## 2020-02-23 ENCOUNTER — Other Ambulatory Visit: Payer: Self-pay

## 2020-02-23 ENCOUNTER — Encounter: Payer: Self-pay | Admitting: Family

## 2020-02-23 ENCOUNTER — Ambulatory Visit (INDEPENDENT_AMBULATORY_CARE_PROVIDER_SITE_OTHER): Payer: BC Managed Care – PPO | Admitting: Family

## 2020-02-23 VITALS — BP 130/80 | HR 106 | Temp 98.3°F | Ht 67.0 in | Wt 162.8 lb

## 2020-02-23 DIAGNOSIS — F321 Major depressive disorder, single episode, moderate: Secondary | ICD-10-CM | POA: Diagnosis not present

## 2020-02-23 DIAGNOSIS — E559 Vitamin D deficiency, unspecified: Secondary | ICD-10-CM | POA: Diagnosis not present

## 2020-02-23 DIAGNOSIS — E663 Overweight: Secondary | ICD-10-CM

## 2020-02-23 DIAGNOSIS — F410 Panic disorder [episodic paroxysmal anxiety] without agoraphobia: Secondary | ICD-10-CM

## 2020-02-23 DIAGNOSIS — F411 Generalized anxiety disorder: Secondary | ICD-10-CM

## 2020-02-23 DIAGNOSIS — F1011 Alcohol abuse, in remission: Secondary | ICD-10-CM | POA: Diagnosis not present

## 2020-02-23 MED ORDER — HYDROXYZINE PAMOATE 25 MG PO CAPS
25.0000 mg | ORAL_CAPSULE | Freq: Three times a day (TID) | ORAL | 2 refills | Status: AC | PRN
Start: 2020-02-23 — End: ?

## 2020-02-23 MED ORDER — BUPROPION HCL ER (XL) 150 MG PO TB24: 150.0000 mg | ORAL_TABLET | Freq: Every day | ORAL | 1 refills | Status: DC

## 2020-02-23 NOTE — Progress Notes (Signed)
Subjective:    Patient ID: Jose Rollins, male    DOB: 12/29/1980, 40 y.o.   MRN: [id number]  Chief Complaint  Patient presents with  . Medical Management of Chronic Issues   Pt presents to the office today for chronic follow up. He is followed by [facility] once a week for therapy. He reports his anxiety and depression is not well controlled.   He reports he quit drinking alcohol in 11/21.  Anxiety Presents for follow-up visit. Symptoms include depressed mood, excessive worry, irritability, nervous/anxious behavior, palpitations, panic and restlessness. Symptoms occur most days. The severity of symptoms is severe.    Depression        This is a chronic problem.  The current episode started more than 1 year ago.   The problem occurs intermittently.  Associated symptoms include helplessness, hopelessness, irritable, restlessness, decreased interest and sad.     The symptoms are aggravated by family issues and work stress.  Past treatments include SNRIs - Serotonin and norepinephrine reuptake inhibitors.  Compliance with treatment is good.  Past medical history includes anxiety.   Nicotine Dependence Presents for follow-up visit. Symptoms include irritability. His urge triggers include company of smokers. The symptoms have been stable. He smokes 1 pack (1 1/2 a day) of cigarettes per day. Compliance with prior treatments has been good.      Review of Systems  Constitutional: Positive for irritability.  Cardiovascular: Positive for palpitations.  Psychiatric/Behavioral: Positive for depression. The patient is nervous/anxious.   All other systems reviewed and are negative.      Objective:   Physical Exam Vitals reviewed.  Constitutional:      General: He is irritable. He is not in acute distress.    Appearance: He is well-developed and well-nourished.  HENT:     Head: Normocephalic.     Right Ear: Tympanic membrane normal.     Left Ear: Tympanic membrane normal.      Mouth/Throat:     Mouth: Oropharynx is clear and moist.  Eyes:     General:        Right eye: No discharge.        Left eye: No discharge.     Pupils: Pupils are equal, round, and reactive to light.  Neck:     Thyroid: No thyromegaly.  Cardiovascular:     Rate and Rhythm: Normal rate and regular rhythm.     Pulses: Intact distal pulses.     Heart sounds: Normal heart sounds. No murmur heard.   Pulmonary:     Effort: Pulmonary effort is normal. No respiratory distress.     Breath sounds: Normal breath sounds. No wheezing.  Abdominal:     General: Bowel sounds are normal. There is no distension.     Palpations: Abdomen is soft.     Tenderness: There is no abdominal tenderness.  Musculoskeletal:        General: No tenderness or edema. Normal range of motion.     Cervical back: Normal range of motion and neck supple.  Skin:    General: Skin is warm and dry.     Findings: No erythema or rash.  Neurological:     Mental Status: He is alert and oriented to person, place, and time.     Cranial Nerves: No cranial nerve deficit.     Deep Tendon Reflexes: Reflexes are normal and symmetric.  Psychiatric:        Mood and Affect: Mood is anxious.  Behavior: Behavior is hyperactive.        Thought Content: Thought content normal.        Judgment: Judgment normal.       BP 130/80   Pulse (!) 106   Temp 98.3 F (36.8 C) (Temporal)   Ht 5' 7"  (1.702 m)   Wt 162 lb 12.8 oz (73.8 kg)   BMI 25.50 kg/m      Assessment & Plan:  D[place] Guard comes in today with chief complaint of Medical Management of Chronic Issues   Diagnosis and orders addressed:  1. GAD (generalized anxiety disorder) Will add Wellbutrin 150 mg today and Vistaril 25 mg  Continue Lexapro and Buspar Stress management discussed Referral to Psychologists placed - buPROPion (WELLBUTRIN XL) 150 MG 24 hr tablet; Take 1 tablet (150 mg total) by mouth daily.  Dispense: 90 tablet; Refill: 1 - CMP14+EGFR -  Anemia Profile B - TSH - hydrOXYzine (VISTARIL) 25 MG capsule; Take 1 capsule (25 mg total) by mouth 3 (three) times daily as needed.  Dispense: 60 capsule; Refill: 2 - Ambulatory referral to Psychiatry  2. Overweight (BMI 25.0-29.9) - CMP14+EGFR - Anemia Profile B  3. Vitamin D deficiency - CMP14+EGFR - Anemia Profile B - VITAMIN D 25 Hydroxy (Vit-D Deficiency, Fractures)  4. Depression, major, single episode, moderate (HCC) - buPROPion (WELLBUTRIN XL) 150 MG 24 hr tablet; Take 1 tablet (150 mg total) by mouth daily.  Dispense: 90 tablet; Refill: 1 - CMP14+EGFR - Anemia Profile B - TSH - Ambulatory referral to Psychiatry  5. H/O alcohol abuse - CMP14+EGFR - Anemia Profile B  6. Panic attack - hydrOXYzine (VISTARIL) 25 MG capsule; Take 1 capsule (25 mg total) by mouth 3 (three) times daily as needed.  Dispense: 60 capsule; Refill: 2   Labs pending Health Maintenance reviewed Diet and exercise encouraged  Follow up plan: 4 weeks    Evelina Dun, FNP

## 2020-02-23 NOTE — Patient Instructions (Signed)
[email or web address]  Generalized Anxiety Disorder, Adult Generalized anxiety disorder (GAD) is a mental health condition. Unlike normal worries, anxiety related to GAD is not triggered by a specific event. These worries do not fade or get better with time. GAD interferes with relationships, work, and school. GAD symptoms can vary from mild to severe. People with severe GAD can have intense waves of anxiety with physical symptoms that are similar to panic attacks. What are the causes? The exact cause of GAD is not known, but the following are believed to have an impact:  Differences in natural brain chemicals.  Genes passed down from parents to children.  Differences in the way threats are perceived.  Development during childhood.  Personality. What increases the risk? The following factors may make you more likely to develop this condition:  Being male.  Having a family history of anxiety disorders.  Being very shy.  Experiencing very stressful life events, such as the death of a loved one.  Having a very stressful family environment. What are the signs or symptoms? People with GAD often worry excessively about many things in their lives, such as their health and family. Symptoms may also include:  Mental and emotional symptoms: ? Worrying excessively about natural disasters. ? Fear of being late. ? Difficulty concentrating. ? Fears that others are judging your performance.  Physical symptoms: ? Fatigue. ? Headaches, muscle tension, muscle twitches, trembling, or feeling shaky. ? Feeling like your heart is pounding or beating very fast. ? Feeling out of breath or like you cannot take a deep breath. ? Having trouble falling asleep or staying asleep, or experiencing restlessness. ? Sweating. ? Nausea, diarrhea, or irritable bowel syndrome (IBS).  Behavioral symptoms: ? Experiencing erratic moods or irritability. ? Avoidance of new situations. ? Avoidance of  people. ? Extreme difficulty making decisions. How is this diagnosed? This condition is diagnosed based on your symptoms and medical history. You will also have a physical exam. Your health care provider may perform tests to rule out other possible causes of your symptoms. To be diagnosed with GAD, a person must have anxiety that:  Is out of his or her control.  Affects several different aspects of his or her life, such as work and relationships.  Causes distress that makes him or her unable to take part in normal activities.  Includes at least three symptoms of GAD, such as restlessness, fatigue, trouble concentrating, irritability, muscle tension, or sleep problems. Before your health care provider can confirm a diagnosis of GAD, these symptoms must be present more days than they are not, and they must last for 6 months or longer. How is this treated? This condition may be treated with:  Medicine. Antidepressant medicine is usually prescribed for long-term daily control. Anti-anxiety medicines may be added in severe cases, especially when panic attacks occur.  Talk therapy (psychotherapy). Certain types of talk therapy can be helpful in treating GAD by providing support, education, and guidance. Options include: ? Cognitive behavioral therapy (CBT). People learn coping skills and self-calming techniques to ease their physical symptoms. They learn to identify unrealistic thoughts and behaviors and to replace them with more appropriate thoughts and behaviors. ? Acceptance and commitment therapy (ACT). This treatment teaches people how to be mindful as a way to cope with unwanted thoughts and feelings. ? Biofeedback. This process trains you to manage your body's response (physiological response) through breathing techniques and relaxation methods. You will work with a therapist while machines are used to monitor your physical   symptoms.  Stress management techniques. These include yoga,  meditation, and exercise. A mental health specialist can help determine which treatment is best for you. Some people see improvement with one type of therapy. However, other people require a combination of therapies.   Follow these instructions at home: Lifestyle  Maintain a consistent routine and schedule.  Anticipate stressful situations. Create a plan, and allow extra time to work with your plan.  Practice stress management or self-calming techniques that you have learned from your therapist or your health care provider. General instructions  Take over-the-counter and prescription medicines only as told by your health care provider.  Understand that you are likely to have setbacks. Accept this and be kind to yourself as you persist to take better care of yourself.  Recognize and accept your accomplishments, even if you judge them as small.  Keep all follow-up visits as told by your health care provider. This is important. Contact a health care provider if:  Your symptoms do not get better.  Your symptoms get worse.  You have signs of depression, such as: ? A persistently sad or irritable mood. ? Loss of enjoyment in activities that used to bring you joy. ? Change in weight or eating. ? Changes in sleeping habits. ? Avoiding friends or family members. ? Loss of energy for normal tasks. ? Feelings of guilt or worthlessness. Get help right away if:  You have serious thoughts about hurting yourself or others. If you ever feel like you may hurt yourself or others, or have thoughts about taking your own life, get help right away. Go to your nearest emergency department or:  Call your local emergency services (911 in the [place]  Call a suicide crisis helpline, such as the National Suicide Prevention Lifeline at 1-800-273-8255. This is open 24 hours a day in the [place]  Text the Crisis Text Line at 741741 (in the [place] Summary  Generalized anxiety disorder (GAD) is a mental  health condition that involves worry that is not triggered by a specific event.  People with GAD often worry excessively about many things in their lives, such as their health and family.  GAD may cause symptoms such as restlessness, trouble concentrating, sleep problems, frequent sweating, nausea, diarrhea, headaches, and trembling or muscle twitching.  A mental health specialist can help determine which treatment is best for you. Some people see improvement with one type of therapy. However, other people require a combination of therapies. This information is not intended to replace advice given to you by your health care provider. Make sure you discuss any questions you have with your health care provider. Document Revised: 10/27/2018 Document Reviewed: 10/27/2018 Elsevier Patient Education  2021 [employer]  

## 2020-02-24 ENCOUNTER — Other Ambulatory Visit: Payer: Self-pay | Admitting: Family

## 2020-02-24 LAB — ANEMIA PROFILE B
Basophils Absolute: 0.1 10*3/uL (ref 0.0–0.2)
Basos: 1 %
EOS (ABSOLUTE): 0.5 10*3/uL — ABNORMAL HIGH (ref 0.0–0.4)
Eos: 5 %
Ferritin: 241 ng/mL (ref 30–400)
Folate: 15.1 ng/mL (ref >3.0)
Hematocrit: 44.9 % (ref 37.5–51.0)
Hemoglobin: 15.4 g/dL (ref 13.0–17.7)
Immature Grans (Abs): 0 10*3/uL (ref 0.0–0.1)
Immature Granulocytes: 0 %
Iron Saturation: 27 % (ref 15–55)
Iron: 71 ug/dL (ref 38–169)
Lymphocytes Absolute: 2.6 10*3/uL (ref 0.7–3.1)
Lymphs: 23 %
MCH: 29.4 pg (ref 26.6–33.0)
MCHC: 34.3 g/dL (ref 31.5–35.7)
MCV: 86 fL (ref 79–97)
Monocytes Absolute: 0.8 10*3/uL (ref 0.1–0.9)
Monocytes: 8 %
Neutrophils Absolute: 6.9 10*3/uL (ref 1.4–7.0)
Neutrophils: 63 %
Platelets: 328 10*3/uL (ref 150–450)
RBC: 5.24 x10E6/uL (ref 4.14–5.80)
RDW: 12.1 % (ref 11.6–15.4)
Retic Ct Pct: 1.1 % (ref 0.6–2.6)
Total Iron Binding Capacity: 267 ug/dL (ref 250–450)
UIBC: 196 ug/dL (ref 111–343)
Vitamin B-12: 703 pg/mL (ref 232–1245)
WBC: 11 10*3/uL — ABNORMAL HIGH (ref 3.4–10.8)

## 2020-02-24 LAB — CMP14+EGFR
ALT: 49 IU/L — ABNORMAL HIGH (ref 0–44)
AST: 21 IU/L (ref 0–40)
Albumin/Globulin Ratio: 1.8 (ref 1.2–2.2)
Albumin: 4.4 g/dL (ref 4.0–5.0)
Alkaline Phosphatase: 95 IU/L (ref 44–121)
BUN/Creatinine Ratio: 10 (ref 9–20)
BUN: 9 mg/dL (ref 6–24)
Bilirubin Total: 0.3 mg/dL (ref 0.0–1.2)
CO2: 25 mmol/L (ref 20–29)
Calcium: 9.6 mg/dL (ref 8.7–10.2)
Chloride: 101 mmol/L (ref 96–106)
Creatinine, Ser: 0.9 mg/dL (ref 0.76–1.27)
GFR calc Af Amer: 123 mL/min/{1.73_m2} (ref >59)
GFR calc non Af Amer: 106 mL/min/{1.73_m2} (ref >59)
Globulin, Total: 2.5 g/dL (ref 1.5–4.5)
Glucose: 162 mg/dL — ABNORMAL HIGH (ref 65–99)
Potassium: 4.4 mmol/L (ref 3.5–5.2)
Sodium: 138 mmol/L (ref 134–144)
Total Protein: 6.9 g/dL (ref 6.0–8.5)

## 2020-02-24 LAB — VITAMIN D 25 HYDROXY (VIT D DEFICIENCY, FRACTURES): Vit D, 25-Hydroxy: 27 ng/mL — ABNORMAL LOW (ref 30.0–100.0)

## 2020-02-24 LAB — TSH: TSH: 2.66 u[IU]/mL (ref 0.450–4.500)

## 2020-02-24 MED ORDER — VITAMIN D (ERGOCALCIFEROL) 1.25 MG (50000 UNIT) PO CAPS: 50000.0000 [IU] | ORAL_CAPSULE | ORAL | 3 refills | Status: AC

## 2020-02-27 LAB — HGB A1C W/O EAG: Hgb A1c MFr Bld: 6.1 % — ABNORMAL HIGH (ref 4.8–5.6)

## 2020-02-27 LAB — SPECIMEN STATUS REPORT

## 2020-02-28 ENCOUNTER — Other Ambulatory Visit: Payer: Self-pay | Admitting: Family

## 2020-02-28 DIAGNOSIS — R7303 Prediabetes: Secondary | ICD-10-CM

## 2020-04-11 ENCOUNTER — Telehealth: Payer: Self-pay | Admitting: *Deleted

## 2020-04-11 NOTE — Telephone Encounter (Signed)
Fax from [facility] Re: Buspirone 7.5 mg 1 TID Pt's insurance requires new Rx for 5, 10 or 15 mg Please fax new Rx approval

## 2020-04-12 MED ORDER — BUSPIRONE HCL 10 MG PO TABS: 10.0000 mg | ORAL_TABLET | Freq: Three times a day (TID) | ORAL | 1 refills | Status: DC | PRN

## 2020-04-12 NOTE — Addendum Note (Signed)
Addended by: Jannifer Rodney A on: 04/12/2020 12:05 PM   Modules accepted: Orders

## 2020-04-12 NOTE — Telephone Encounter (Signed)
Buspar increased to 10 mg from 7.5 mg per insurance coverage.

## 2020-04-12 NOTE — Telephone Encounter (Signed)
Patient aware and verbalizes understanding. 

## 2020-06-14 ENCOUNTER — Other Ambulatory Visit: Payer: Self-pay | Admitting: Family

## 2020-08-11 ENCOUNTER — Other Ambulatory Visit: Payer: Self-pay | Admitting: Family

## 2020-08-11 DIAGNOSIS — F411 Generalized anxiety disorder: Secondary | ICD-10-CM

## 2020-08-11 DIAGNOSIS — F321 Major depressive disorder, single episode, moderate: Secondary | ICD-10-CM
